# Patient Record
Sex: Female | Born: 1970 | Race: White | Hispanic: No | Marital: Married | Smoking: Never smoker
Health system: Southern US, Community
[De-identification: ages and names within clinical notes are randomized; demographics above are authoritative.]

## PROBLEM LIST (undated history)

## (undated) DIAGNOSIS — O34219 Maternal care for unspecified type scar from previous cesarean delivery: Secondary | ICD-10-CM

## (undated) DIAGNOSIS — R079 Chest pain, unspecified: Secondary | ICD-10-CM

## (undated) DIAGNOSIS — Z8679 Personal history of other diseases of the circulatory system: Secondary | ICD-10-CM

## (undated) DIAGNOSIS — R001 Bradycardia, unspecified: Secondary | ICD-10-CM

## (undated) DIAGNOSIS — A389 Scarlet fever, uncomplicated: Secondary | ICD-10-CM

## (undated) DIAGNOSIS — F4541 Pain disorder exclusively related to psychological factors: Secondary | ICD-10-CM

## (undated) DIAGNOSIS — S42009A Fracture of unspecified part of unspecified clavicle, initial encounter for closed fracture: Secondary | ICD-10-CM

## (undated) HISTORY — DX: Personal history of other diseases of the circulatory system: Z86.79

## (undated) HISTORY — DX: Bradycardia, unspecified: R00.1

---

## 1998-06-22 ENCOUNTER — Other Ambulatory Visit: Admission: RE | Admit: 1998-06-22 | Discharge: 1998-06-22 | Payer: Self-pay | Admitting: Obstetrics and Gynecology

## 2000-02-18 ENCOUNTER — Other Ambulatory Visit: Admission: RE | Admit: 2000-02-18 | Discharge: 2000-02-18 | Payer: Self-pay | Admitting: Obstetrics and Gynecology

## 2000-03-06 ENCOUNTER — Other Ambulatory Visit: Admission: RE | Admit: 2000-03-06 | Discharge: 2000-03-06 | Payer: Self-pay | Admitting: Obstetrics and Gynecology

## 2000-03-18 ENCOUNTER — Ambulatory Visit (HOSPITAL_COMMUNITY): Admission: RE | Admit: 2000-03-18 | Discharge: 2000-03-18 | Payer: Self-pay | Admitting: Obstetrics and Gynecology

## 2001-02-11 ENCOUNTER — Other Ambulatory Visit: Admission: RE | Admit: 2001-02-11 | Discharge: 2001-02-11 | Payer: Self-pay | Admitting: Obstetrics and Gynecology

## 2001-03-24 ENCOUNTER — Encounter: Payer: Self-pay | Admitting: Internal Medicine

## 2001-03-24 ENCOUNTER — Ambulatory Visit (HOSPITAL_COMMUNITY): Admission: RE | Admit: 2001-03-24 | Discharge: 2001-03-24 | Payer: Self-pay

## 2001-07-29 ENCOUNTER — Other Ambulatory Visit: Admission: RE | Admit: 2001-07-29 | Discharge: 2001-07-29 | Payer: Self-pay | Admitting: Obstetrics and Gynecology

## 2002-02-15 ENCOUNTER — Other Ambulatory Visit: Admission: RE | Admit: 2002-02-15 | Discharge: 2002-02-15 | Payer: Self-pay | Admitting: Obstetrics and Gynecology

## 2002-07-29 ENCOUNTER — Other Ambulatory Visit: Admission: RE | Admit: 2002-07-29 | Discharge: 2002-07-29 | Payer: Self-pay | Admitting: Obstetrics and Gynecology

## 2003-03-10 ENCOUNTER — Other Ambulatory Visit: Admission: RE | Admit: 2003-03-10 | Discharge: 2003-03-10 | Payer: Self-pay | Admitting: Obstetrics and Gynecology

## 2004-03-26 ENCOUNTER — Other Ambulatory Visit: Admission: RE | Admit: 2004-03-26 | Discharge: 2004-03-26 | Payer: Self-pay | Admitting: Obstetrics and Gynecology

## 2005-01-01 ENCOUNTER — Ambulatory Visit (HOSPITAL_COMMUNITY): Admission: RE | Admit: 2005-01-01 | Discharge: 2005-01-01 | Payer: Self-pay | Admitting: Obstetrics and Gynecology

## 2005-02-08 ENCOUNTER — Inpatient Hospital Stay (HOSPITAL_COMMUNITY): Admission: AD | Admit: 2005-02-08 | Discharge: 2005-02-11 | Payer: Self-pay | Admitting: Obstetrics and Gynecology

## 2005-03-22 ENCOUNTER — Other Ambulatory Visit: Admission: RE | Admit: 2005-03-22 | Discharge: 2005-03-22 | Payer: Self-pay | Admitting: Obstetrics and Gynecology

## 2008-07-27 ENCOUNTER — Emergency Department (HOSPITAL_COMMUNITY): Admission: EM | Admit: 2008-07-27 | Discharge: 2008-07-28 | Payer: Self-pay | Admitting: Emergency Medicine

## 2008-10-19 ENCOUNTER — Emergency Department (HOSPITAL_COMMUNITY): Admission: EM | Admit: 2008-10-19 | Discharge: 2008-10-19 | Payer: Self-pay | Admitting: Family Medicine

## 2009-02-06 ENCOUNTER — Emergency Department (HOSPITAL_COMMUNITY): Admission: EM | Admit: 2009-02-06 | Discharge: 2009-02-06 | Payer: Self-pay | Admitting: Cardiovascular Disease

## 2010-11-11 ENCOUNTER — Encounter: Payer: Self-pay | Admitting: Obstetrics and Gynecology

## 2011-01-30 LAB — POCT URINALYSIS DIP (DEVICE)
Bilirubin Urine: NEGATIVE
Glucose, UA: NEGATIVE mg/dL
Ketones, ur: NEGATIVE mg/dL
Nitrite: POSITIVE — AB
Protein, ur: NEGATIVE mg/dL
Specific Gravity, Urine: 1.01 (ref 1.005–1.030)
Urobilinogen, UA: 0.2 mg/dL (ref 0.0–1.0)
pH: 6 (ref 5.0–8.0)

## 2011-03-08 NOTE — Op Note (Signed)
NAMEFABIOLA, Tammy Gibbs                ACCOUNT NO.:  [id number]   MEDICAL RECORD NO.:  [id number]          PATIENT TYPE:  INP   LOCATION:  9118                          FACILITY:  [facility]   PHYSICIAN:  Zenaida Niece, M.D.DATE OF BIRTH:  1971/07/25   DATE OF PROCEDURE:  02/08/2005  DATE OF DISCHARGE:                                 OPERATIVE REPORT   PREOPERATIVE DIAGNOSES:  1.  Intrauterine pregnancy at 37+ weeks.  2.  Previous cesarean section.  3.  Premature rupture of membranes.  4.  Thrombocytopenia.   POSTOPERATIVE DIAGNOSES:  1.  Intrauterine pregnancy at 37+ weeks.  2.  Previous cesarean section.  3.  Premature rupture of membranes.  4.  Thrombocytopenia.   PROCEDURE:  Repeat low transverse cesarean section.   SURGEON:  Zenaida Niece, M.D.   ANESTHESIA:  General.   ESTIMATED BLOOD LOSS:  800 mL.   FINDINGS:  A viable female infant with Apgars of 9 and 9 and weighed 5 pounds  7 ounces.   PROCEDURE IN DETAIL:  The patient was taken to the operating room and placed  in the dorsal supine position with a left lateral tilt.  Abdomen was prepped  and draped in the usual sterile fashion and a Foley catheter was placed.  General anesthesia was then induced and abdomen was entered via her previous  Pfannenstiel incision.  Bladder blade was placed and the bladder retracted inferior.  A 4 cm  transverse incision was made in the lower uterine segment, pushing the  bladder inferior.  Uterine incision was extended bilaterally digitally.  The  fetal vertex was then grasped and brought to the incision.  It had some  difficulty delivering in through the incision.  The vacuum was applied and  with gentle traction the head delivered atraumatically.  Mouth and nares  were suctioned.  The remainder of the infant then delivered atraumatically.  Cord was doubly clamped and cut and the infant handed to the awaiting  pediatric team.  Cord blood was obtained and placenta delivered  spontaneously.  Uterus was  wiped dry with a clean lap pad and all clots and debris removed.  The  uterine incision was inspected and found to be free of the free of  extensions.  Uterine incision was then closed in one layer being running  locking layer with #1 chromic with adequate hemostasis.  Bleeding from  serosal edges was controlled with electrocautery.  Bladder was inspected and found to be inferior to the incision and urine was  clear.  Tubes and ovaries were inspected and found to be normal.  Uterine  incision was again inspected and found to be hemostatic.  Subfascial space  was then irrigated and made hemostatic with electrocautery.  Fascia was  closed in a running fashion starting at both ends and meeting in the middle  with 0 Vicryl.  Subcutaneous tissue was then irrigated and made hemostatic  with electrocautery.  Skin was then closed with staples and a sterile  dressing.  The patient tolerated the procedure well.  She was extubated in  the operating room and  taken to recovery in stable condition. Counts were  correct x2, and she received Ancef 1 g after cord clamp.      TDM/MEDQ  D:  02/08/2005  T:  02/08/2005  Job:  [id number]

## 2011-03-08 NOTE — Discharge Summary (Signed)
Tammy Gibbs, Tammy Gibbs NO.:  [id number]   MEDICAL RECORD NO.:  [id number]          PATIENT TYPE:  INP   LOCATION:  9118                          [facility]ILITY:  [facility]   PHYSICIAN:  Malachi Pro. Ambrose Mantle, M.D. DATE OF BIRTH:  03-Oct-1971   DATE OF ADMISSION:  02/08/2005  DATE OF DISCHARGE:  02/10/2005                                 DISCHARGE SUMMARY   HISTORY:  This 40 year old white female para 1-0-0-1, gravida 2 at 3 plus  weeks gestation by last period compatible with a 9-week ultrasound with [facility]  Feb 26, 2005 presented complaining of spontaneous rupture of membranes at  9:45 p.m. on February 07, 2005 with occasional contractions, no vaginal  bleeding, good fetal movement.  The patient had had a prior cesarean  section.  Blood group and type A positive, negative antibody, RPR  nonreactive, rubella immune, hepatitis B surface antigen negative, GC and  Chlamydia negative, triple screen normal, 1-hour glucola 89.  Patient had a  previous C-section.  She was scheduled for repeat C-section in 1 week, size  was less than dates with ultrasound on January 02, 2005, average gestational  age with estimated fetal weight of approximately 1600 g with normal AFI.   OBSTETRIC HISTORY:  The patient had a low transverse cervical cesarean  section at 37 plus weeks in 1995 for fetopelvic disproportion 5-pound 7-  ounce infant.   GYNECOLOGIC HISTORY:  LEEP and cone biopsy in 2001 with normal followup.   MEDICAL HISTORY:  Negative.   SURGICAL HISTORY:  C-section.   MEDICATIONS:  None.   ALLERGIES:  No known drug allergies.   SOCIAL HISTORY:  Married.  No tobacco.   FAMILY HISTORY:  Negative.   HOSPITAL COURSE:  On admission the patient was contracting every 4-5  minutes, normal fetal heart tones, patient's abdomen was gravid and  nontender, transverse scar was present.  Vaginal exam was deferred.  The  patient had positive Nitrazine and positive fern.  Heart and lungs were  normal.  The  patient wanted a repeat C-section and Dr. Jackelyn Knife proceeded  with the low transverse cervical C-section with delivery of a living female  infant 5 pounds 7 ounces with Apgars of 9 at one and 9 at five minutes.  Postpartum the patient did quite well, she had no problems, she voided well  without difficulty and tolerated a regular diet, passed flatus, her incision  healed well and staples were removed and strips were applied on the day of  discharge.  On the second postpartum day the patient was doing as previously  stated and was ready for discharge.   LABORATORY DATA:  Initial urinalysis showed 30 mg% of protein, a hemoglobin  of 12.5, hematocrit 37.2, white count 10,500, platelet count 74,000.  Repeat  hemoglobin was 11.3, hematocrit 33.2, white count 9700, platelet count  68,000.   FINAL DIAGNOSIS:  Intrauterine pregnancy at 37 plus weeks with prior  cesarean section in labor with ruptured membranes.  Patient was prepared and  underwent a cesarean section.   OPERATION:  Low transverse cervical cesarean section.   ADDITIONAL DIAGNOSIS:  Thrombocytopenia.  The patient should have this  platelet count repeated at followup examinations in the office.  The patient  declines analgesics at discharge and is advised to return in 10-14 days for  followup examination.  She is given a copy of our discharge instruction  booklet.      TFH/MEDQ  D:  02/10/2005  T:  02/10/2005  Job:  [id number]

## 2011-05-27 ENCOUNTER — Ambulatory Visit (HOSPITAL_COMMUNITY)
Admission: RE | Admit: 2011-05-27 | Discharge: 2011-05-27 | Disposition: A | Discharge status: Home / Self Care | Payer: BC Managed Care – PPO | Source: Ambulatory Visit | Attending: Internal Medicine | Admitting: Internal Medicine

## 2011-05-27 ENCOUNTER — Other Ambulatory Visit (HOSPITAL_COMMUNITY): Payer: Self-pay | Admitting: Internal Medicine

## 2011-05-27 DIAGNOSIS — IMO0002 Reserved for concepts with insufficient information to code with codable children: Secondary | ICD-10-CM

## 2011-05-27 DIAGNOSIS — M546 Pain in thoracic spine: Secondary | ICD-10-CM

## 2011-05-27 DIAGNOSIS — M549 Dorsalgia, unspecified: Secondary | ICD-10-CM | POA: Insufficient documentation

## 2011-07-23 LAB — CK TOTAL AND CKMB (NOT AT ARMC)
CK, MB: 1.4
Relative Index: INVALID

## 2011-07-23 LAB — CBC
HCT: 43.1
Platelets: 149 — ABNORMAL LOW
RDW: 13.8

## 2011-07-23 LAB — DIFFERENTIAL
Basophils Relative: 0
Monocytes Absolute: 0.7
Monocytes Relative: 7
Neutro Abs: 5.6

## 2011-07-23 LAB — TROPONIN I: Troponin I: 0.02

## 2011-07-23 LAB — COMPREHENSIVE METABOLIC PANEL
AST: 22
Albumin: 4.1
Alkaline Phosphatase: 73
BUN: 13
GFR calc Af Amer: >60
Potassium: 4.2
Sodium: 136
Total Protein: 7.5

## 2011-07-23 LAB — POCT CARDIAC MARKERS: Troponin i, poc: <0.05

## 2011-07-26 LAB — POCT URINALYSIS DIP (DEVICE)
Bilirubin Urine: NEGATIVE
Ketones, ur: NEGATIVE mg/dL
pH: 6 (ref 5.0–8.0)

## 2011-07-26 LAB — URINE CULTURE: Colony Count: >=100000

## 2011-10-22 HISTORY — DX: Maternal care for unspecified type scar from previous cesarean delivery: O34.219

## 2012-07-21 DIAGNOSIS — Z8679 Personal history of other diseases of the circulatory system: Secondary | ICD-10-CM

## 2012-07-21 DIAGNOSIS — R001 Bradycardia, unspecified: Secondary | ICD-10-CM

## 2012-07-21 HISTORY — DX: Bradycardia, unspecified: R00.1

## 2012-07-21 HISTORY — DX: Personal history of other diseases of the circulatory system: Z86.79

## 2012-08-28 ENCOUNTER — Encounter (HOSPITAL_BASED_OUTPATIENT_CLINIC_OR_DEPARTMENT_OTHER): Payer: Self-pay | Admitting: *Deleted

## 2012-08-28 ENCOUNTER — Inpatient Hospital Stay (HOSPITAL_BASED_OUTPATIENT_CLINIC_OR_DEPARTMENT_OTHER)
Admission: EM | Admit: 2012-08-28 | Discharge: 2012-08-30 | DRG: 145 | Disposition: A | Discharge status: Home / Self Care | Payer: BC Managed Care – PPO | Attending: Pulmonary Disease | Admitting: Pulmonary Disease

## 2012-08-28 ENCOUNTER — Emergency Department (HOSPITAL_BASED_OUTPATIENT_CLINIC_OR_DEPARTMENT_OTHER): Payer: BC Managed Care – PPO

## 2012-08-28 DIAGNOSIS — I959 Hypotension, unspecified: Principal | ICD-10-CM | POA: Diagnosis present

## 2012-08-28 DIAGNOSIS — I498 Other specified cardiac arrhythmias: Secondary | ICD-10-CM

## 2012-08-28 DIAGNOSIS — R001 Bradycardia, unspecified: Secondary | ICD-10-CM

## 2012-08-28 HISTORY — DX: Scarlet fever, uncomplicated: A38.9

## 2012-08-28 HISTORY — DX: Chest pain, unspecified: R07.9

## 2012-08-28 HISTORY — DX: Fracture of unspecified part of unspecified clavicle, initial encounter for closed fracture: S42.009A

## 2012-08-28 HISTORY — DX: Pain disorder exclusively related to psychological factors: F45.41

## 2012-08-28 LAB — CK TOTAL AND CKMB (NOT AT ARMC)
CK, MB: 3.2 ng/mL (ref 0.3–4.0)
Relative Index: INVALID (ref 0.0–2.5)
Total CK: 69 U/L (ref 7–177)

## 2012-08-28 LAB — CBC WITH DIFFERENTIAL/PLATELET
Basophils Absolute: 0 10*3/uL (ref 0.0–0.1)
Eosinophils Relative: 4 % (ref 0–5)
Eosinophils Relative: 3 % (ref 0–5)
HCT: 41 % (ref 36.0–46.0)
Hemoglobin: 14.2 g/dL (ref 12.0–15.0)
Lymphocytes Relative: 24 % (ref 12–46)
Lymphocytes Relative: 29 % (ref 12–46)
Lymphs Abs: 2.3 10*3/uL (ref 0.7–4.0)
MCV: 86.3 fL (ref 78.0–100.0)
Monocytes Absolute: 0.4 10*3/uL (ref 0.1–1.0)
Neutro Abs: 5.5 10*3/uL (ref 1.7–7.7)
Platelets: 134 10*3/uL — ABNORMAL LOW (ref 150–400)
RBC: 4.75 MIL/uL (ref 3.87–5.11)
RDW: 13.1 % (ref 11.5–15.5)
WBC: 8.5 10*3/uL (ref 4.0–10.5)
WBC: 8 10*3/uL (ref 4.0–10.5)

## 2012-08-28 LAB — COMPREHENSIVE METABOLIC PANEL
ALT: 13 U/L (ref 0–35)
AST: 18 U/L (ref 0–37)
Albumin: 3.8 g/dL (ref 3.5–5.2)
BUN: 11 mg/dL (ref 6–23)
CO2: 25 mEq/L (ref 19–32)
Calcium: 9.4 mg/dL (ref 8.4–10.5)
Chloride: 98 mEq/L (ref 96–112)
Creatinine, Ser: 0.68 mg/dL (ref 0.50–1.10)
GFR calc non Af Amer: >90 mL/min (ref >90)
Potassium: 3.9 mEq/L (ref 3.5–5.1)
Sodium: 133 mEq/L — ABNORMAL LOW (ref 135–145)
Total Protein: 7.4 g/dL (ref 6.0–8.3)

## 2012-08-28 LAB — URINALYSIS, ROUTINE W REFLEX MICROSCOPIC
Bilirubin Urine: NEGATIVE
Glucose, UA: NEGATIVE mg/dL
Hgb urine dipstick: NEGATIVE
Specific Gravity, Urine: 1.017 (ref 1.005–1.030)
Urobilinogen, UA: 0.2 mg/dL (ref 0.0–1.0)

## 2012-08-28 LAB — LACTIC ACID, PLASMA: Lactic Acid, Venous: 0.7 mmol/L (ref 0.5–2.2)

## 2012-08-28 LAB — TROPONIN I: Troponin I: <0.3 ng/mL (ref <0.30)

## 2012-08-28 MED ORDER — ACETAMINOPHEN 650 MG RE SUPP: 650.0000 mg | Freq: Four times a day (QID) | RECTAL | Status: DC | PRN

## 2012-08-28 MED ORDER — ACETAMINOPHEN 325 MG PO TABS
650.0000 mg | ORAL_TABLET | Freq: Four times a day (QID) | ORAL | Status: DC | PRN
  Administered 2012-08-29: 650 mg via ORAL
  Filled 2012-08-28: qty 2

## 2012-08-28 MED ORDER — SODIUM CHLORIDE 0.9 % IJ SOLN
3.0000 mL | Freq: Two times a day (BID) | INTRAMUSCULAR | Status: DC
  Administered 2012-08-29 – 2012-08-30 (×2): 3 mL via INTRAVENOUS

## 2012-08-28 MED ORDER — SODIUM CHLORIDE 0.9 % IV SOLN
Freq: Once | INTRAVENOUS | Status: AC
  Administered 2012-08-28: 14:00:00 via INTRAVENOUS

## 2012-08-28 NOTE — ED Notes (Signed)
Family at bedside. 

## 2012-08-28 NOTE — ED Provider Notes (Signed)
History     CSN: [id number]  Arrival date & time 08/28/12  1215   First MD Initiated Contact with Patient 08/28/12 1256      Chief Complaint  Patient presents with  . Dizziness  . Tingling  . Fatigue    (Consider location/radiation/quality/duration/timing/severity/associated sxs/prior treatment) Patient is a 41 y.o. female presenting with weakness. The history is provided by the patient. No language interpreter was used.  Weakness The primary symptoms include focal weakness. Primary symptoms do not include fever, nausea or vomiting. The symptoms began 6 to 12 hours ago. The symptoms are worsening.  Additional symptoms include weakness. Medical issues do not include seizures, cancer, alcohol use, drug use or diabetes.  Pt reports she had 1st episode of weakness on 10/31.   Pt reports she felt dizzy and had tingling in the top of her head.  Pt reports she had an episode last night that resolved.  Pt reports weak this am.   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     History reviewed. No pertinent past medical history.  History reviewed. No pertinent past surgical history.  History reviewed. No pertinent family history.  History  Substance Use Topics  . Smoking status: Never Smoker   . Smokeless tobacco: Not on file  . Alcohol Use:     OB History    Grav Para Term Preterm Abortions TAB SAB Ect Mult Living                  Review of Systems  Constitutional: Negative for fever.  Gastrointestinal: Negative for nausea and vomiting.  Neurological: Positive for focal weakness and weakness.  All other systems reviewed and are negative.    Allergies  Review of patient's allergies indicates no known allergies.  Home Medications   Current Outpatient Rx  Name  Route  Sig  Dispense  Refill  . LEVONORGESTREL 20 MCG/24HR IU IUD   Intrauterine   1 each by Intrauterine route once.           BP 87/59  Pulse 64  Physical Exam  Nursing note and vitals  reviewed. Constitutional: She is oriented to person, place, and time. She appears well-developed and well-nourished.  HENT:  Head: Normocephalic and atraumatic.  Right Ear: External ear normal.  Left Ear: External ear normal.  Nose: Nose normal.  Mouth/Throat: Oropharynx is clear and moist.  Eyes: Conjunctivae normal and EOM are normal. Pupils are equal, round, and reactive to light.  Neck: Normal range of motion. Neck supple.  Cardiovascular: Normal rate and normal heart sounds.   Pulmonary/Chest: Effort normal and breath sounds normal.  Abdominal: Soft. Bowel sounds are normal.  Musculoskeletal: Normal range of motion.  Neurological: She is alert and oriented to person, place, and time. She has normal reflexes.  Skin: Skin is warm.  Psychiatric: She has a normal mood and affect.    ED Course  Procedures (including critical care time)  Labs Reviewed - No data to display No results found.   1. Hypotension   2. Bradycardia    Pt given Iv fluids x 1 liter,  No change in blood pressure or heart rate.   I reviewed monitor strips with Dr. Karma Ganja.   No sign of heart block.  Labs returned no sign of dehydration,  Cardiac markers normal   Date: 08/28/2012  Rate: 49  Rhythm: sinus bradycardia  QRS Axis: normal  Intervals: normal  ST/T Wave abnormalities: normal  Conduction Disutrbances:none  Narrative Interpretation:  Old EKG Reviewed: changes noted bradycardia  MDM    I spoke to Hospitalist who advised CCM consult.   Critical Care advised cardiology.   I spoke to Dr. Eden Emms who reviewed ekg and labs and advised medical admission.   I spoke to triad hospitalist a second time and then to critical care a 2nd time.   Critical Care,   Dr. Herma Carson agreed to accept pt.  Pt to go to [facility] ICU for admision.   CRITICAL CARE Performed by: [facility]   Total critical care time: 30  Critical care time was exclusive of separately billable procedures and treating other patients.  Critical  care was necessary to treat or prevent imminent or life-threatening deterioration.  Critical care was time spent personally by me on the following activities: development of treatment plan with patient and/or surrogate as well as nursing, discussions with consultants, evaluation of patient's response to treatment, examination of patient, obtaining history from patient or surrogate, ordering and performing treatments and interventions, ordering and review of laboratory studies, ordering and review of radiographic studies, pulse oximetry and re-evaluation of patient's condition.  Lonia Skinner [place], [place] 08/28/12 1710

## 2012-08-28 NOTE — ED Notes (Signed)
Pt. Is saline locked with no fluids infusing at present time.

## 2012-08-28 NOTE — H&P (Signed)
Name: Tammy Gibbs MRN: [id number] DOB: 1971/03/06    LOS: 0   PULMONARY / CRITICAL CARE MEDICINE ADMISSION  HPI:  41 yo F with minimal past medical history transferred from [facility] with bradycardia and hypotension.  Yesterday evening she developed fatigue and dizziness.  She drank several glasses of fluids and went to bed.  This AM she continued to have symptoms and saw here PCP who sent her to the [facility] for IVF given SBP in 80s.  There she was noted to have a HR in the 40s and SBP in the 70s-80s.  She received IVF with modest improvement and was transferred to [facility].  Curently HR ~60 and SBP > 100.  Over the past 24 hours she has also had episodes of 24 hours she has also had short episodes of tingling in her head and fingers.    She has a similar episode 1 week ago which resolved.  She currently feels better with only mild fatigue. She denies fever, chills, CP, nausea, abdominal pain, palpitations, syncope, or pain.  No recent medications.  Had chest pain several years ago and had work-up with stress test and echo which was normal.  History reviewed. No pertinent past medical history. History reviewed. No pertinent past surgical history. Prior to Admission medications   Medication Sig Start Date End Date Taking? Authorizing Provider  butalbital-acetaminophen-caffeine (FIORICET, ESGIC) 50-325-40 MG per tablet Take 1 tablet by mouth daily as needed. For headache   Yes Historical Provider, MD  ibuprofen (ADVIL,MOTRIN) 200 MG tablet Take 200 mg by mouth every 6 (six) hours as needed. For head aches   Yes Historical Provider, MD  levonorgestrel (MIRENA) 20 MCG/24HR IUD 1 each by Intrauterine route once.   Yes Historical Provider, MD   Allergies No Known Allergies  Family History History reviewed. No pertinent family history.  HTN and HPL in parents. Social History  reports that she has never smoked. She does not have any smokeless tobacco history on file. Her  alcohol and drug histories not on file.  Works as ED Charity fundraiser.  Review Of Systems:  Review of 10 systems negative except as listed in HPI.  Brief patient description:  41 yo F with bradycardia and hypotension.  Events Since Admission:   Current Status:  Vital Signs: Temp:  [98.6 F (37 C)] 98.6 F (37 C) (11/08 1715) Pulse Rate:  [52-73] 69  (11/08 2000) Resp:  [16-17] 16  (11/08 2000) BP: (77-114)/(45-65) 114/61 mmHg (11/08 2000) SpO2:  [96 %-100 %] 100 % (11/08 2000) Weight:  [60.328 kg (133 lb)] 60.328 kg (133 lb) (11/08 1715)  Physical Examination: General:  NAD, well appearing Neuro:  Awake, alert, oriented x 3, full str in all 4 extremities, normal sensation in all 4 extremities HEENT:  PERRL, EOMI, sclera clear, MMM, no oral lesions. Neck:  Supple, no cervical or supraclavicular LAD, no thyromegaly Cardiovascular:  Slightly bradycardic, regular, no m/r/g Lungs:  CTAB, nl WOB Abdomen:  Soft, NT, ND Musculoskeletal:  Joints wnl Skin:  No rash Extremities: No edema or cyanosis   CMP     Component Value Date/Time   NA 133* 08/28/2012 1250   K 4.1 08/28/2012 1250   CL 98 08/28/2012 1250   CO2 25 08/28/2012 1250   GLUCOSE 96 08/28/2012 1250   BUN 14 08/28/2012 1250   CREATININE 0.80 08/28/2012 1250   CALCIUM 9.4 08/28/2012 1250   PROT 7.1 08/28/2012 1250   ALBUMIN 3.6 08/28/2012 1250  AST 18 08/28/2012 1250   ALT 13 08/28/2012 1250   ALKPHOS 61 08/28/2012 1250   BILITOT 0.3 08/28/2012 1250   GFRNONAA >90 08/28/2012 1250   GFRAA >90 08/28/2012 1250   Cardiac Panel (last 3 results)  Basename 08/28/12 1555  CKTOTAL 69  CKMB 3.2  TROPONINI <0.30  RELINDX RELATIVE INDEX IS INVALID   CBC    Component Value Date/Time   WBC 8.5 08/28/2012 1250   RBC 4.49 08/28/2012 1250   HGB 13.1 08/28/2012 1250   HCT 38.1 08/28/2012 1250   PLT 134* 08/28/2012 1250   MCV 84.9 08/28/2012 1250   MCH 29.2 08/28/2012 1250   MCHC 34.4 08/28/2012 1250   RDW 13.1 08/28/2012 1250   LYMPHSABS 2.0  08/28/2012 1250   MONOABS 0.6 08/28/2012 1250   EOSABS 0.3 08/28/2012 1250   BASOSABS 0.0 08/28/2012 1250   CXR - No acute cardiopulmonary abnormality seen.   EKG - sinus brady, rate 58   ASSESSMENT AND PLAN  41 yo F with dizziness and fatigue likely due to bradycardia and hypotension. - Monitor on tele - Repeat chem, CBC and cardiac enzymes now,. Check TSH - Repeat EKG in AM - Order TTE - Will need to consult cardiology in AM  BEST PRACTICE / DISPOSITION Level of Care:  ICU Primary Service:  PCCM Consultants:  Card needs to be consulted in AM Code Status:  Full Diet:  Regular DVT Px:  SCDs GI Px:  No indicated Skin Integrity:  No issues Social / Family:  Not present at time of admission   Tarra Pence, M.D. Pulmonary and Critical Care Medicine [facility] Pager: [phone]  08/28/2012, 8:14 PM

## 2012-08-28 NOTE — ED Notes (Signed)
Pt to room 10 in w/c able to stand and walk to stretcher from w/c with cg@. Pt reports feeling dizzy, weak and tingling to top of her head on the night of 10/31. Pt states these sx resolved, but returned this morning. Pt states she had tingling in the top of her head, dizzy feelings and generalized weakness this am. Pt speech is clear, moe + x 4 ext, grips are = and strong, smile is symetrical, pearl. Pt denies any ha or any other c/o.

## 2012-08-28 NOTE — ED Notes (Signed)
Pt.  Report given to [employer] in ICU at [facility].

## 2012-08-29 ENCOUNTER — Encounter (HOSPITAL_COMMUNITY): Payer: Self-pay | Admitting: *Deleted

## 2012-08-29 DIAGNOSIS — I959 Hypotension, unspecified: Principal | ICD-10-CM

## 2012-08-29 DIAGNOSIS — R001 Bradycardia, unspecified: Secondary | ICD-10-CM | POA: Diagnosis present

## 2012-08-29 DIAGNOSIS — I498 Other specified cardiac arrhythmias: Secondary | ICD-10-CM

## 2012-08-29 LAB — MRSA PCR SCREENING: MRSA by PCR: POSITIVE — AB

## 2012-08-29 LAB — CK TOTAL AND CKMB (NOT AT ARMC): Relative Index: INVALID (ref 0.0–2.5)

## 2012-08-29 LAB — TSH: TSH: 1.254 u[IU]/mL (ref 0.350–4.500)

## 2012-08-29 MED ORDER — MUPIROCIN 2 % EX OINT
1.0000 "application " | TOPICAL_OINTMENT | Freq: Two times a day (BID) | CUTANEOUS | Status: DC
  Administered 2012-08-29 – 2012-08-30 (×3): 1 via NASAL
  Filled 2012-08-29: qty 22

## 2012-08-29 MED ORDER — CHLORHEXIDINE GLUCONATE CLOTH 2 % EX PADS
6.0000 | MEDICATED_PAD | Freq: Every day | CUTANEOUS | Status: DC
  Administered 2012-08-29 – 2012-08-30 (×2): 6 via TOPICAL

## 2012-08-29 MED ORDER — INFLUENZA VIRUS VACC SPLIT PF IM SUSP
0.5000 mL | INTRAMUSCULAR | Status: DC
  Filled 2012-08-29: qty 0.5

## 2012-08-29 NOTE — Progress Notes (Signed)
Vascular lab here to do 2D Echo.

## 2012-08-29 NOTE — Progress Notes (Signed)
CRITICAL VALUE ALERT  Critical value received: +PCR MRSA swab   Date of notification:  08/29/12  Time of notification:  1009  Critical value read back:yes  Nurse who received alert:  Harlow Asa, RN  MD notified (1st page):  Randon Goldsmith, NP  Time of first page:  1050  MD notified (2nd page):  Time of second page:  Responding MD: Dirk Dress, NP  Time MD responded:  1057

## 2012-08-29 NOTE — Progress Notes (Signed)
Transferred to 3W 04 via wheelchair and with monitor after report called to Bosie Clos, Charity fundraiser.Transported by Jamal Maes, Rn. On generic medical pathway.

## 2012-08-29 NOTE — Progress Notes (Signed)
Name: KATALEAH BEJAR MRN: [id number] DOB: August 28, 1971    LOS: 1   PULMONARY / CRITICAL CARE MEDICINE ADMISSION  HPI:  41 yo F with minimal past medical history transferred from [facility] with bradycardia and hypotension.  Yesterday evening she developed fatigue and dizziness.  She drank several glasses of fluids and went to bed.  This AM she continued to have symptoms and saw here PCP who sent her to the [facility] for IVF given SBP in 80s.  There she was noted to have a HR in the 40s and SBP in the 70s-80s.  She received IVF with modest improvement and was transferred to [facility].  Curently HR ~60 and SBP > 100.  Over the past 24 hours she has also had episodes of 24 hours she has also had short episodes of tingling in her head and fingers.    She has a similar episode 1 week ago which resolved.  She currently feels better with only mild fatigue. She denies fever, chills, CP, nausea, abdominal pain, palpitations, syncope, or pain.  No recent medications.  Had chest pain several years ago and had work-up with stress test and echo which was normal.  Brief patient description:  41 yo F with bradycardia and hypotension.  Events Since Admission: Improved 11/9. HR now in low 80s  NSR  Current Status: Stable  Vital Signs: Temp:  [97.9 F (36.6 C)-98.6 F (37 C)] 98.3 F (36.8 C) (11/09 0736) Pulse Rate:  [52-93] 92  (11/09 0853) Resp:  [11-19] 13  (11/09 0853) BP: (77-114)/(44-65) 98/54 mmHg (11/09 0800) SpO2:  [95 %-100 %] 98 % (11/09 0853) Weight:  [60.328 kg (133 lb)] 60.328 kg (133 lb) (11/08 1715)  Physical Examination: General:  NAD, well appearing Neuro:  Awake, alert, oriented x 3, full str in all 4 extremities, normal sensation in all 4 extremities HEENT:  PERRL, EOMI, sclera clear, MMM, no oral lesions. Neck:  Supple, no cervical or supraclavicular LAD, no thyromegaly Cardiovascular:  Slightly bradycardic, regular, no m/r/g Lungs:  CTAB, nl WOB Abdomen:  Soft,  NT, ND Musculoskeletal:  Joints wnl Skin:  No rash Extremities: No edema or cyanosis   CMP     Component Value Date/Time   NA 138 08/28/2012 2042   K 3.9 08/28/2012 2042   CL 104 08/28/2012 2042   CO2 23 08/28/2012 2042   GLUCOSE 76 08/28/2012 2042   BUN 11 08/28/2012 2042   CREATININE 0.68 08/28/2012 2042   CALCIUM 9.3 08/28/2012 2042   PROT 7.4 08/28/2012 2042   ALBUMIN 3.8 08/28/2012 2042   AST 20 08/28/2012 2042   ALT 14 08/28/2012 2042   ALKPHOS 70 08/28/2012 2042   BILITOT 0.5 08/28/2012 2042   GFRNONAA >90 08/28/2012 2042   GFRAA >90 08/28/2012 2042   Cardiac Panel (last 3 results)  Basename 08/28/12 2042 08/28/12 1555  CKTOTAL 88 69  CKMB 3.5 3.2  TROPONINI -- <0.30  RELINDX RELATIVE INDEX IS INVALID RELATIVE INDEX IS INVALID   CBC    Component Value Date/Time   WBC 8.0 08/28/2012 2042   RBC 4.75 08/28/2012 2042   HGB 14.2 08/28/2012 2042   HCT 41.0 08/28/2012 2042   PLT 108* 08/28/2012 2042   MCV 86.3 08/28/2012 2042   MCH 29.9 08/28/2012 2042   MCHC 34.6 08/28/2012 2042   RDW 13.2 08/28/2012 2042   LYMPHSABS 2.3 08/28/2012 2042   MONOABS 0.4 08/28/2012 2042   EOSABS 0.3 08/28/2012 2042   BASOSABS 0.0  08/28/2012 2042   CXR - No acute cardiopulmonary abnormality seen.   EKG - sinus rhythm 80  ASSESSMENT AND PLAN  41 yo F with dizziness and fatigue likely due to bradycardia and hypotension.  ??etiology W/u for chest pain three years ago per [facility] unremarkable - Monitor on tele>>tfr to tele bed out of ICU -Cardiology consult, Dr Tresa Endo to see  Keep on PCCM svc as may d/c in 24hrs.   BEST PRACTICE / DISPOSITION Level of Care:  ICU>>>TELE Primary Service:  PCCM Consultants:  Card Tresa Endo Code Status:  Full Diet:  Regular DVT Px:  SCDs GI Px:  No indicated Skin Integrity:  No issues Social / Family:updated at bedside  Shan Levans, M.D. Pulmonary and Critical Care Medicine [facility]  [phone]  Cell  [phone]  If no response or cell goes to  voicemail, call beeper [phone]   08/29/2012, 12:02 PM

## 2012-08-29 NOTE — Consult Note (Signed)
Reason for Consult:  Bradycardia Referring Physician: [name] is an 41 y.o. female.  HPI:   The patient is a 41 yo female with a history of chest pain three years ago, scarlet fever.  Never used tobacco.  She had a cardiology work-up at [facility] three years ago which included stress and echo.  Apparently everything was ok.   She started to feel a "tingling" feeling on the right, top side of her head on holloween night along with her fingers and feet, as well as dizziness and slurred speech.  She went to bed that night and felt better the next day. All symptoms returned, except slurred speech, and occurred at rest.  She went to med [facility] after seeing her PCP for fluids.  BP at the time was in the 70's with a HR in the 40-50's.  She denies N,V, fever, cough, congestion, CP, SOB, othopnea, LEE, dysuria, hematuria, diarrhea, constipation.  She currently feels better.  Past Medical History  Diagnosis Date  . Pregnancy with history of caesarean section, antepartum   . Clavicular fracture   . Chest pain   . Scarlet fever   . Stress headaches     History reviewed. No pertinent past surgical history.  History reviewed. No pertinent family history.   MI,  Grandfather with Afib.    Social History:  reports that she has never smoked. She does not have any smokeless tobacco history on file. Her alcohol and drug histories not on file.  Allergies: No Known Allergies  Medications:     . [COMPLETED] sodium chloride   Intravenous Once  . Chlorhexidine Gluconate Cloth  6 each Topical Q0600  . influenza  inactive virus vaccine  0.5 mL Intramuscular Tomorrow-1000  . mupirocin ointment  1 application Nasal BID  . sodium chloride  3 mL Intravenous Q12H     Results for orders placed during the hospital encounter of 08/28/12 (from the past 48 hour(s))  CBC WITH DIFFERENTIAL     Status: Abnormal   Collection Time   08/28/12 12:50 PM      Component Value Range Comment   WBC 8.5  4.0 -  10.5 K/uL    RBC 4.49  3.87 - 5.11 MIL/uL    Hemoglobin 13.1  12.0 - 15.0 g/dL    HCT 16.1  09.6 - 04.5 %    MCV 84.9  78.0 - 100.0 fL    MCH 29.2  26.0 - 34.0 pg    MCHC 34.4  30.0 - 36.0 g/dL    RDW 40.9  81.1 - 91.4 %    Platelets 134 (*) 150 - 400 K/uL    Neutrophils Relative 65  43 - 77 %    Neutro Abs 5.5  1.7 - 7.7 K/uL    Lymphocytes Relative 24  12 - 46 %    Lymphs Abs 2.0  0.7 - 4.0 K/uL    Monocytes Relative 8  3 - 12 %    Monocytes Absolute 0.6  0.1 - 1.0 K/uL    Eosinophils Relative 4  0 - 5 %    Eosinophils Absolute 0.3  0.0 - 0.7 K/uL    Basophils Relative 0  0 - 1 %    Basophils Absolute 0.0  0.0 - 0.1 K/uL   COMPREHENSIVE METABOLIC PANEL     Status: Abnormal   Collection Time   08/28/12 12:50 PM      Component Value Range Comment   Sodium 133 (*) 135 -  145 mEq/L    Potassium 4.1  3.5 - 5.1 mEq/L    Chloride 98  96 - 112 mEq/L    CO2 25  19 - 32 mEq/L    Glucose, Bld 96  70 - 99 mg/dL    BUN 14  6 - 23 mg/dL    Creatinine, Ser 1.61  0.50 - 1.10 mg/dL    Calcium 9.4  8.4 - 09.6 mg/dL    Total Protein 7.1  6.0 - 8.3 g/dL    Albumin 3.6  3.5 - 5.2 g/dL    AST 18  0 - 37 U/L    ALT 13  0 - 35 U/L    Alkaline Phosphatase 61  39 - 117 U/L    Total Bilirubin 0.3  0.3 - 1.2 mg/dL    GFR calc non Af Amer >90  >90 mL/min    GFR calc Af Amer >90  >90 mL/min   URINALYSIS, ROUTINE W REFLEX MICROSCOPIC     Status: Normal   Collection Time   08/28/12  2:01 PM      Component Value Range Comment   Color, Urine YELLOW  YELLOW    APPearance CLEAR  CLEAR    Specific Gravity, Urine 1.017  1.005 - 1.030    pH 6.5  5.0 - 8.0    Glucose, UA NEGATIVE  NEGATIVE mg/dL    Hgb urine dipstick NEGATIVE  NEGATIVE    Bilirubin Urine NEGATIVE  NEGATIVE    Ketones, ur NEGATIVE  NEGATIVE mg/dL    Protein, ur NEGATIVE  NEGATIVE mg/dL    Urobilinogen, UA 0.2  0.0 - 1.0 mg/dL    Nitrite NEGATIVE  NEGATIVE    Leukocytes, UA NEGATIVE  NEGATIVE MICROSCOPIC NOT DONE ON URINES WITH NEGATIVE  PROTEIN, BLOOD, LEUKOCYTES, NITRITE, OR GLUCOSE <1000 mg/dL.  PREGNANCY, URINE     Status: Normal   Collection Time   08/28/12  2:01 PM      Component Value Range Comment   Preg Test, Ur NEGATIVE  NEGATIVE   TROPONIN I     Status: Normal   Collection Time   08/28/12  3:55 PM      Component Value Range Comment   Troponin I <0.30  <0.30 ng/mL   CK TOTAL AND CKMB     Status: Normal   Collection Time   08/28/12  3:55 PM      Component Value Range Comment   Total CK 69  7 - 177 U/L    CK, MB 3.2  0.3 - 4.0 ng/mL    Relative Index RELATIVE INDEX IS INVALID  0.0 - 2.5   SEDIMENTATION RATE     Status: Normal   Collection Time   08/28/12  3:55 PM      Component Value Range Comment   Sed Rate 4  0 - 22 mm/hr   LACTIC ACID, PLASMA     Status: Normal   Collection Time   08/28/12  4:03 PM      Component Value Range Comment   Lactic Acid, Venous 0.7  0.5 - 2.2 mmol/L   COMPREHENSIVE METABOLIC PANEL     Status: Normal   Collection Time   08/28/12  8:42 PM      Component Value Range Comment   Sodium 138  135 - 145 mEq/L    Potassium 3.9  3.5 - 5.1 mEq/L    Chloride 104  96 - 112 mEq/L    CO2 23  19 - 32 mEq/L  Glucose, Bld 76  70 - 99 mg/dL    BUN 11  6 - 23 mg/dL    Creatinine, Ser 2.95  0.50 - 1.10 mg/dL    Calcium 9.3  8.4 - 62.1 mg/dL    Total Protein 7.4  6.0 - 8.3 g/dL    Albumin 3.8  3.5 - 5.2 g/dL    AST 20  0 - 37 U/L    ALT 14  0 - 35 U/L    Alkaline Phosphatase 70  39 - 117 U/L    Total Bilirubin 0.5  0.3 - 1.2 mg/dL    GFR calc non Af Amer >90  >90 mL/min    GFR calc Af Amer >90  >90 mL/min   CBC WITH DIFFERENTIAL     Status: Abnormal   Collection Time   08/28/12  8:42 PM      Component Value Range Comment   WBC 8.0  4.0 - 10.5 K/uL    RBC 4.75  3.87 - 5.11 MIL/uL    Hemoglobin 14.2  12.0 - 15.0 g/dL    HCT 30.8  65.7 - 84.6 %    MCV 86.3  78.0 - 100.0 fL    MCH 29.9  26.0 - 34.0 pg    MCHC 34.6  30.0 - 36.0 g/dL    RDW 96.2  95.2 - 84.1 %    Platelets 108 (*) 150 -  400 K/uL    Neutrophils Relative 63  43 - 77 %    Neutro Abs 5.1  1.7 - 7.7 K/uL    Lymphocytes Relative 29  12 - 46 %    Lymphs Abs 2.3  0.7 - 4.0 K/uL    Monocytes Relative 5  3 - 12 %    Monocytes Absolute 0.4  0.1 - 1.0 K/uL    Eosinophils Relative 3  0 - 5 %    Eosinophils Absolute 0.3  0.0 - 0.7 K/uL    Basophils Relative 0  0 - 1 %    Basophils Absolute 0.0  0.0 - 0.1 K/uL   TSH     Status: Normal   Collection Time   08/28/12  8:42 PM      Component Value Range Comment   TSH 1.254  0.350 - 4.500 uIU/mL   CK TOTAL AND CKMB     Status: Normal   Collection Time   08/28/12  8:42 PM      Component Value Range Comment   Total CK 88  7 - 177 U/L    CK, MB 3.5  0.3 - 4.0 ng/mL    Relative Index RELATIVE INDEX IS INVALID  0.0 - 2.5   MRSA PCR SCREENING     Status: Abnormal   Collection Time   08/29/12  7:21 AM      Component Value Range Comment   MRSA by PCR POSITIVE (*) NEGATIVE     Dg Chest 2 View  08/28/2012  *RADIOLOGY REPORT*  Clinical Data: Bradycardia, dizziness.  CHEST - 2 VIEW  Comparison: May 27, 2011.  Findings: Cardiomediastinal silhouette appears normal.  No acute pulmonary disease is noted.  Bony thorax is intact.  IMPRESSION: No acute cardiopulmonary abnormality seen.   Original Report Authenticated By: Lupita Raider.,  M.D.     Review of Systems  Constitutional: Negative for fever and diaphoresis.  HENT: Negative for congestion and sore throat.   Eyes: Negative for blurred vision and double vision.  Respiratory: Negative for cough and shortness of breath.  Cardiovascular: Negative for chest pain, palpitations, orthopnea, leg swelling and PND.  Gastrointestinal: Negative for nausea, vomiting, abdominal pain, diarrhea, constipation, blood in stool and melena.  Genitourinary: Negative for dysuria and hematuria.  Musculoskeletal: Negative for myalgias.  Neurological: Positive for dizziness, tingling (Top of head and ringers and toes. ) and speech change (Slurred  on Holloween night.). Negative for focal weakness.   Blood pressure 98/54, pulse 92, temperature 98.4 F (36.9 C), temperature source Oral, resp. rate 13, height 5\' 1"  (1.549 m), weight 60.328 kg (133 lb), SpO2 98.00%. Physical Exam  Constitutional: She is oriented to person, place, and time. She appears well-developed and well-nourished. No distress.  HENT:  Head: Normocephalic and atraumatic.  Eyes: EOM are normal. Pupils are equal, round, and reactive to light. No scleral icterus.  Neck: Normal range of motion. Neck supple.  Cardiovascular: Normal rate and regular rhythm.   No murmur heard. Pulses:      Radial pulses are 2+ on the right side, and 2+ on the left side.       Dorsalis pedis pulses are 2+ on the right side, and 2+ on the left side.       Maybe a faint right carotid bruit  Respiratory: Effort normal and breath sounds normal. She has no wheezes. She has no rales.  GI: Soft. Bowel sounds are normal. She exhibits no distension. There is no tenderness.  Musculoskeletal: She exhibits no edema.  Lymphadenopathy:    She has no cervical adenopathy.  Neurological: She is alert and oriented to person, place, and time. No cranial nerve deficit. She exhibits normal muscle tone.  Skin: Skin is warm and dry.  Psychiatric: She has a normal mood and affect.    Assessment/Plan: Patient Active Hospital Problem List: Bradycardia (08/29/2012) Hypotension (08/29/2012)   Plan:  EKG shows NSR with rate of 89BPM.  BP improved.  It is likely dizziness is related to hypotension but should consider arrhythmia?  Will recommend 30 day monitor to assess.    HAGER, BRYAN 08/29/2012, 12:29 PM   Patient seen and examined. Agree with assessment and plan.  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  Reportedly 3 years ago patient had a negative cardiac w/u in our office (will try to obtain records). Pt admits to transient head tingling on  Halloween, recurred 1 week later, and yesterday noted dizziness and recurrent tingling. At [facility] reportedly pulse was in the upper 40's, and BP 77 for which she was treated with fluids and transferred here.  Currently BP 11/60 and P 73.  ECG this am reveals NSR at 89 with normal intervals.  PE is essentially unremarkable. Echo being done now and preliminary review does not real any significant structural abnormality. EF normal.  Minimal bowing of Anterior MV without prolapse. No AS. Pt to be transferred from SICU to telemetry. Anticipate probable dc in am with out patient monitor,   Lennette Bihari, MD, [facility] 08/29/2012 2:30 PM

## 2012-08-30 LAB — BASIC METABOLIC PANEL
CO2: 26 mEq/L (ref 19–32)
Calcium: 9 mg/dL (ref 8.4–10.5)
GFR calc non Af Amer: 88 mL/min — ABNORMAL LOW (ref >90)
Glucose, Bld: 100 mg/dL — ABNORMAL HIGH (ref 70–99)
Potassium: 3.9 mEq/L (ref 3.5–5.1)
Sodium: 135 mEq/L (ref 135–145)

## 2012-08-30 LAB — CBC
Hemoglobin: 13.7 g/dL (ref 12.0–15.0)
MCH: 29.3 pg (ref 26.0–34.0)
Platelets: 126 10*3/uL — ABNORMAL LOW (ref 150–400)
RBC: 4.67 MIL/uL (ref 3.87–5.11)
WBC: 7.3 10*3/uL (ref 4.0–10.5)

## 2012-08-30 NOTE — Progress Notes (Signed)
[facility]  Subjective: No Complaints  Objective: Vital signs in last 24 hours: Temp:  [98.1 F (36.7 C)-98.9 F (37.2 C)] 98.2 F (36.8 C) (11/10 0500) Pulse Rate:  [63-85] 63  (11/10 0500) Resp:  [12-20] 16  (11/10 0500) BP: (100-113)/(55-74) 107/67 mmHg (11/10 0500) SpO2:  [99 %-100 %] 100 % (11/10 0500) Last BM Date: 08/30/12  Intake/Output from previous day: 11/09 0701 - 11/10 0700 In: 845 [P.O.:845] Out: 1150 [Urine:1150] Intake/Output this shift: Total I/O In: 363 [P.O.:360; I.V.:3] Out: -   Medications Current Facility-Administered Medications  Medication Dose Route Frequency Provider Last Rate Last Dose  . acetaminophen (TYLENOL) tablet 650 mg  650 mg Oral Q6H PRN Domenick Roma, MD   650 mg at 08/29/12 0853   Or  . acetaminophen (TYLENOL) suppository 650 mg  650 mg Rectal Q6H PRN Domenick Roma, MD      . Chlorhexidine Gluconate Cloth 2 % PADS 6 each  6 each Topical Q0600 Bernadene Person, NP   6 each at 08/30/12 [phone]  . influenza  inactive virus vaccine (FLUZONE/FLUARIX) injection 0.5 mL  0.5 mL Intramuscular Tomorrow-1000 Domenick Roma, MD      . mupirocin ointment (BACTROBAN) 2 % 1 application  1 application Nasal BID Bernadene Person, NP   1 application at 08/30/12 [phone]  . sodium chloride 0.9 % injection 3 mL  3 mL Intravenous Q12H Domenick Roma, MD   3 mL at 08/30/12 0102    PE: General appearance: alert, cooperative and no distress Lungs: clear to auscultation bilaterally Heart: regular rate and rhythm, S1, S2 normal, no murmur, click, rub or gallop Pulses: 2+ and symmetric Neurologic: Grossly normal  Lab Results:   Basename 08/30/12 0550 08/28/12 2042 08/28/12 1250  WBC 7.3 8.0 8.5  HGB 13.7 14.2 13.1  HCT 39.9 41.0 38.1  PLT 126* 108* 134*   BMET  Basename 08/30/12 0550 08/28/12 2042 08/28/12 1250  NA 135 138 133*  K 3.9 3.9 4.1  CL 100 104 98  CO2 26 23 25   GLUCOSE 100* 76 96  BUN 10 11 14     CREATININE 0.82 0.68 0.80  CALCIUM 9.0 9.3 9.4    Assessment/Plan    Active Problems:  Bradycardia  Hypotension  Plan:  No abnormalities on tele.  BP and HR stable.  Will arrange 30 heart monitor at [facility].   LOS: 2 days   HAGER, BRYAN 08/30/2012 10:51 AM  I was not able to see the patient prior to discharge due to an emergent cardiac catheterization.  I did discuss the patient & the plan of care with Mr. Leron Croak. I agree with his findings, examination as well as impression recommendations.  30 day monitor to evaluate extent & severity of bradycardia per Dr. Landry Dyke initial recommendations. OK for d/c from cardiac standpoint.  Marykay Lex, M.D., M.S. [facility] [address] [place], [place]  [address]  [phone] Pager # [phone] 08/30/2012 2:33 PM

## 2012-08-30 NOTE — Discharge Summary (Signed)
Physician Discharge Summary  Patient ID: REDINA ZELLER MRN: [id number] DOB/AGE: 41-Apr-1972 41 y.o.  Admit date: 08/28/2012 Discharge date: 08/30/2012    Discharge Diagnoses:  Active Problems:  Bradycardia  Hypotension    Brief Summary:  41 yo F with minimal past medical history transferred 11/8 from [facility] with bradycardia and hypotension. Yesterday evening she developed fatigue and dizziness. She drank several glasses of fluids and went to bed. This AM she continued to have symptoms and saw here PCP who sent her to the [facility] for IVF given SBP in 80s. There she was noted to have a HR in the 40s and SBP in the 70s-80s. She received IVF with modest improvement and was transferred to [facility].    Consults: Cardiology   Lines/tubes: none  Microbiology/Sepsis markers: none  Significant Diagnostic Studies:  2D echo 11/9>>> EF 55-60%.  LV normal. No significant valvular abnormalities.                                                                     Hospital Summary by Discharge Diagnosis   Bradycardia / Hypotension -- unknown etiology.  Cardiac w/u for chest pain 3 years ago unremarkable.  HR, BP now wnl with minimal intervention (gentle IV fluids).  NSR.  2D echo normal.  Pt followed by cardiology [facility]) who are ok with d/c.  They will arrange outpt monitor. At time of d/c bp 113/74 and HR 65-75 NSR.   Dizziness/ Fatigue - likely r/t above.  Resolved.   Filed Vitals:   08/29/12 1616 08/29/12 1809 08/29/12 2100 08/30/12 0500  BP:  113/74 108/66 107/67  Pulse:  85 74 63  Temp: 98.9 F (37.2 C) 98.3 F (36.8 C) 98.1 F (36.7 C) 98.2 F (36.8 C)  TempSrc: Oral Oral    Resp:  18 18 16   Height:      Weight:      SpO2:  100% 100% 100%     Discharge Labs  BMET  Lab 08/30/12 0550 08/28/12 2042 08/28/12 1250  NA 135 138 133*  K 3.9 3.9 --  CL 100 104 98  CO2 26 23 25   GLUCOSE 100* 76 96  BUN 10 11 14   CREATININE 0.82 0.68 0.80    CALCIUM 9.0 9.3 9.4  MG -- -- --  PHOS -- -- --     CBC   Lab 08/30/12 0550 08/28/12 2042 08/28/12 1250  HGB 13.7 14.2 13.1  HCT 39.9 41.0 38.1  WBC 7.3 8.0 8.5  PLT 126* 108* 134*          Follow-up Information    Follow up with Lennette Bihari, MD. ([facility] call you to arrange heart monitor and follow up)    Contact information:   [address] [place] [address] [phone]       Follow up with Nadean Corwin, MD. Schedule an appointment as soon as possible for a visit in 2 weeks.   Contact information:   [address] [place] [address] [phone]             Medication List     As of 08/30/2012  1:51 PM    CONTINUE taking these medications  butalbital-acetaminophen-caffeine 50-325-40 MG per tablet   Commonly known as: FIORICET, ESGIC      ibuprofen 200 MG tablet   Commonly known as: ADVIL,MOTRIN      levonorgestrel 20 MCG/24HR IUD   Commonly known as: MIRENA          Disposition: Home   Discharged Condition: TRISTYNN COUPAL has met maximum benefit of inpatient care and is medically stable and cleared for discharge.  Patient is pending follow up as above.      Time spent on disposition:  Greater than 35 minutes.   SignedDanford Bad, NP 08/30/2012  1:51 PM Pager: [phone] or [phone]  *Care during the described time interval was provided by me and/or other providers on the critical care team. I have reviewed this patient's available data, including medical history, events of note, physical examination and test results as part of my evaluation.  Suzie Portela, MD  [phone]

## 2012-09-01 NOTE — Progress Notes (Signed)
Utilization review completed- retro 

## 2012-09-01 NOTE — ED Provider Notes (Signed)
Medical screening examination/treatment/procedure(s) were performed by non-physician practitioner and as supervising physician I was immediately available for consultation/collaboration.  Ethelda Chick, MD 09/01/12 [phone]

## 2012-12-24 ENCOUNTER — Encounter: Payer: Self-pay | Admitting: Cardiology

## 2013-01-15 ENCOUNTER — Encounter: Payer: Self-pay | Admitting: Cardiovascular Disease

## 2014-05-24 ENCOUNTER — Ambulatory Visit (INDEPENDENT_AMBULATORY_CARE_PROVIDER_SITE_OTHER): Payer: Commercial Managed Care - PPO | Admitting: Physician Assistant

## 2014-05-24 ENCOUNTER — Encounter: Payer: Self-pay | Admitting: Physician Assistant

## 2014-05-24 VITALS — BP 110/70 | HR 88 | Temp 97.9°F | Resp 16 | Ht 61.0 in | Wt 135.0 lb

## 2014-05-24 DIAGNOSIS — R1032 Left lower quadrant pain: Secondary | ICD-10-CM

## 2014-05-24 DIAGNOSIS — K5900 Constipation, unspecified: Secondary | ICD-10-CM

## 2014-05-24 LAB — CBC WITH DIFFERENTIAL/PLATELET
BASOS ABS: 0 10*3/uL (ref 0.0–0.1)
BASOS PCT: 0 % (ref 0–1)
EOS ABS: 0.3 10*3/uL (ref 0.0–0.7)
Eosinophils Relative: 5 % (ref 0–5)
HCT: 40.4 % (ref 36.0–46.0)
Hemoglobin: 14.2 g/dL (ref 12.0–15.0)
Lymphocytes Relative: 28 % (ref 12–46)
Lymphs Abs: 1.8 10*3/uL (ref 0.7–4.0)
MCH: 29.1 pg (ref 26.0–34.0)
MCHC: 35.1 g/dL (ref 30.0–36.0)
MCV: 82.8 fL (ref 78.0–100.0)
Monocytes Absolute: 0.8 10*3/uL (ref 0.1–1.0)
Monocytes Relative: 12 % (ref 3–12)
NEUTROS PCT: 55 % (ref 43–77)
Neutro Abs: 3.6 10*3/uL (ref 1.7–7.7)
PLATELETS: 153 10*3/uL (ref 150–400)
RBC: 4.88 MIL/uL (ref 3.87–5.11)
RDW: 14.1 % (ref 11.5–15.5)
WBC: 6.6 10*3/uL (ref 4.0–10.5)

## 2014-05-24 NOTE — Patient Instructions (Signed)
Benefiber is good for constipation/diarrhea/irritable bowel syndrome, it helps with weight loss and can help lower your bad cholesterol. Please do 1-2 TBSP in the morning in water, coffee, or tea. It can take up to a month before you can see a difference with your bowel movements. It is cheapest from [employer], [employer], [employer].   Constipation Constipation is when a person has fewer than three bowel movements a week, has difficulty having a bowel movement, or has stools that are dry, hard, or larger than normal. As people grow older, constipation is more common. If you try to fix constipation with medicines that make you have a bowel movement (laxatives), the problem may get worse. Long-term laxative use may cause the muscles of the colon to become weak. A low-fiber diet, not taking in enough fluids, and taking certain medicines may make constipation worse.  CAUSES   Certain medicines, such as antidepressants, pain medicine, iron supplements, antacids, and water pills.   Certain diseases, such as diabetes, irritable bowel syndrome (IBS), thyroid disease, or depression.   Not drinking enough water.   Not eating enough fiber-rich foods.   Stress or travel.   Lack of physical activity or exercise.   Ignoring the urge to have a bowel movement.   Using laxatives too much.  SIGNS AND SYMPTOMS   Having fewer than three bowel movements a week.   Straining to have a bowel movement.   Having stools that are hard, dry, or larger than normal.   Feeling full or bloated.   Pain in the lower abdomen.   Not feeling relief after having a bowel movement.  DIAGNOSIS  Your health care provider will take a medical history and perform a physical exam. Further testing may be done for severe constipation. Some tests may include:  A barium enema X-ray to examine your rectum, colon, and, sometimes, your small intestine.   A sigmoidoscopy to examine your lower colon.   A colonoscopy to  examine your entire colon. TREATMENT  Treatment will depend on the severity of your constipation and what is causing it. Some dietary treatments include drinking more fluids and eating more fiber-rich foods. Lifestyle treatments may include regular exercise. If these diet and lifestyle recommendations do not help, your health care provider may recommend taking over-the-counter laxative medicines to help you have bowel movements. Prescription medicines may be prescribed if over-the-counter medicines do not work.  HOME CARE INSTRUCTIONS   Eat foods that have a lot of fiber, such as fruits, vegetables, whole grains, and beans.  Limit foods high in fat and processed sugars, such as french fries, hamburgers, cookies, candies, and soda.   A fiber supplement may be added to your diet if you cannot get enough fiber from foods.   Drink enough fluids to keep your urine clear or pale yellow.   Exercise regularly or as directed by your health care provider.   Go to the restroom when you have the urge to go. Do not hold it.   Only take over-the-counter or prescription medicines as directed by your health care provider. Do not take other medicines for constipation without talking to your health care provider first.  SEEK IMMEDIATE MEDICAL CARE IF:   You have bright red blood in your stool.   Your constipation lasts for more than 4 days or gets worse.   You have abdominal or rectal pain.   You have thin, pencil-like stools.   You have unexplained weight loss. MAKE SURE YOU:   Understand these instructions.  Will watch your condition.  Will get help right away if you are not doing well or get worse. Document Released: 07/05/2004 Document Revised: 10/12/2013 Document Reviewed: 07/19/2013 [facility] Patient Information 2015 [place], [place]. This information is not intended to replace advice given to you by your health care provider. Make sure you discuss any questions you have with your  health care provider.

## 2014-05-24 NOTE — Progress Notes (Signed)
   Subjective:    Patient ID: Tammy PrimusAngela C Gibbs, female    DOB: 01/23/1971, 43 y.o.   MRN: [id number]  HPI 43 y.o. female with history of constipation presents with nausea, mucus, and LLQ discomfort. On Friday she had diarrhea, then on Saturday she was having fluctuance with just mucus no blood and then today she had some stool with mucus. She has been having cramping, LLQ pain, and nausea with food. Denies fever, chills, weight loss. Went on a cruise in middle of July, no symptoms previously. Dad with polyps and both parents with diverticulitis.    Review of Systems  Constitutional: Negative.   HENT: Negative.   Respiratory: Negative.   Cardiovascular: Negative.   Gastrointestinal: Positive for nausea, abdominal pain and constipation. Negative for vomiting, diarrhea, blood in stool, abdominal distention, anal bleeding and rectal pain.  Genitourinary: Negative.   Neurological: Negative.        Objective:   Physical Exam  Vitals reviewed. Constitutional: She is oriented to person, place, and time. She appears well-developed and well-nourished.  Neck: Normal range of motion. Neck supple.  Cardiovascular: Normal rate and regular rhythm.   Pulmonary/Chest: Effort normal and breath sounds normal.  Abdominal: Soft. Bowel sounds are normal. She exhibits no mass. There is tenderness in the right lower quadrant and left lower quadrant. There is guarding. There is no rigidity, no rebound, no tenderness at McBurney's point and negative Murphy's sign.  Neurological: She is alert and oriented to person, place, and time.  Skin: Skin is warm and dry. No rash noted.       Assessment & Plan:  Constipation/mucus with benign AB exam, likely IBS- check labs, get on Benefiber, try probiotic samples, bland food, increase water, can try linzess if not better. Follow up 1 month.

## 2014-05-25 LAB — HEPATIC FUNCTION PANEL
ALBUMIN: 3.9 g/dL (ref 3.5–5.2)
ALT: 13 U/L (ref 0–35)
AST: 19 U/L (ref 0–37)
Alkaline Phosphatase: 58 U/L (ref 39–117)
BILIRUBIN TOTAL: 0.5 mg/dL (ref 0.2–1.2)
Bilirubin, Direct: 0.1 mg/dL (ref 0.0–0.3)
Indirect Bilirubin: 0.4 mg/dL (ref 0.2–1.2)
Total Protein: 6.9 g/dL (ref 6.0–8.3)

## 2014-05-25 LAB — BASIC METABOLIC PANEL WITH GFR
BUN: 14 mg/dL (ref 6–23)
CHLORIDE: 103 meq/L (ref 96–112)
CO2: 28 mEq/L (ref 19–32)
Calcium: 9.2 mg/dL (ref 8.4–10.5)
Creat: 0.79 mg/dL (ref 0.50–1.10)
GFR, Est African American: >89 mL/min
Glucose, Bld: 76 mg/dL (ref 70–99)
POTASSIUM: 4.5 meq/L (ref 3.5–5.3)
Sodium: 139 mEq/L (ref 135–145)

## 2014-05-25 LAB — SEDIMENTATION RATE: SED RATE: 4 mm/h (ref 0–22)

## 2014-05-31 ENCOUNTER — Ambulatory Visit (INDEPENDENT_AMBULATORY_CARE_PROVIDER_SITE_OTHER): Payer: Commercial Managed Care - PPO | Admitting: Internal Medicine

## 2014-05-31 ENCOUNTER — Encounter: Payer: Self-pay | Admitting: Internal Medicine

## 2014-05-31 VITALS — BP 106/68 | HR 60 | Temp 98.1°F | Resp 16 | Ht 61.75 in | Wt 139.8 lb

## 2014-05-31 DIAGNOSIS — M545 Low back pain, unspecified: Secondary | ICD-10-CM

## 2014-05-31 DIAGNOSIS — M5432 Sciatica, left side: Secondary | ICD-10-CM

## 2014-05-31 DIAGNOSIS — R51 Headache: Secondary | ICD-10-CM | POA: Insufficient documentation

## 2014-05-31 DIAGNOSIS — M5430 Sciatica, unspecified side: Secondary | ICD-10-CM

## 2014-05-31 DIAGNOSIS — R519 Headache, unspecified: Secondary | ICD-10-CM | POA: Insufficient documentation

## 2014-05-31 MED ORDER — HYDROCODONE-ACETAMINOPHEN 5-325 MG PO TABS: ORAL_TABLET | ORAL | Status: DC

## 2014-05-31 MED ORDER — PREDNISONE 20 MG PO TABS: ORAL_TABLET | ORAL | Status: DC

## 2014-05-31 NOTE — Patient Instructions (Signed)
Sciatica Sciatica is pain, weakness, numbness, or tingling along the path of the sciatic nerve. The nerve starts in the lower back and runs down the back of each leg. The nerve controls the muscles in the lower leg and in the back of the knee, while also providing sensation to the back of the thigh, lower leg, and the sole of your foot. Sciatica is a symptom of another medical condition. For instance, nerve damage or certain conditions, such as a herniated disk or bone spur on the spine, pinch or put pressure on the sciatic nerve. This causes the pain, weakness, or other sensations normally associated with sciatica. Generally, sciatica only affects one side of the body. CAUSES   Herniated or slipped disc.  Degenerative disk disease.  A pain disorder involving the narrow muscle in the buttocks (piriformis syndrome).  Pelvic injury or fracture.  Pregnancy.  Tumor (rare). SYMPTOMS  Symptoms can vary from mild to very severe. The symptoms usually travel from the low back to the buttocks and down the back of the leg. Symptoms can include:  Mild tingling or dull aches in the lower back, leg, or hip.  Numbness in the back of the calf or sole of the foot.  Burning sensations in the lower back, leg, or hip.  Sharp pains in the lower back, leg, or hip.  Leg weakness.  Severe back pain inhibiting movement. These symptoms may get worse with coughing, sneezing, laughing, or prolonged sitting or standing. Also, being overweight may worsen symptoms. DIAGNOSIS  Your caregiver will perform a physical exam to look for common symptoms of sciatica. He or she may ask you to do certain movements or activities that would trigger sciatic nerve pain. Other tests may be performed to find the cause of the sciatica. These may include:  Blood tests.  X-rays.  Imaging tests, such as an MRI or CT scan. TREATMENT  Treatment is directed at the cause of the sciatic pain. Sometimes, treatment is not necessary  and the pain and discomfort goes away on its own. If treatment is needed, your caregiver may suggest:  Over-the-counter medicines to relieve pain.  Prescription medicines, such as anti-inflammatory medicine, muscle relaxants, or narcotics.  Applying heat or ice to the painful area.  Steroid injections to lessen pain, irritation, and inflammation around the nerve.  Reducing activity during periods of pain.  Exercising and stretching to strengthen your abdomen and improve flexibility of your spine. Your caregiver may suggest losing weight if the extra weight makes the back pain worse.  Physical therapy.  Surgery to eliminate what is pressing or pinching the nerve, such as a bone spur or part of a herniated disk. HOME CARE INSTRUCTIONS   Only take over-the-counter or prescription medicines for pain or discomfort as directed by your caregiver.  Apply ice to the affected area for 20 minutes, 3-4 times a day for the first 48-72 hours. Then try heat in the same way.  Exercise, stretch, or perform your usual activities if these do not aggravate your pain.  Attend physical therapy sessions as directed by your caregiver.  Keep all follow-up appointments as directed by your caregiver.  Do not wear high heels or shoes that do not provide proper support.  Check your mattress to see if it is too soft. A firm mattress may lessen your pain and discomfort. SEEK IMMEDIATE MEDICAL CARE IF:   You lose control of your bowel or bladder (incontinence).  You have increasing weakness in the lower back, pelvis, buttocks,   or legs.  You have redness or swelling of your back.  You have a burning sensation when you urinate.  You have pain that gets worse when you lie down or awakens you at night.  Your pain is worse than you have experienced in the past.  Your pain is lasting longer than 4 weeks.  You are suddenly losing weight without reason. MAKE SURE YOU:  Understand these  instructions.  Will watch your condition.  Will get help right away if you are not doing well or get worse. Document Released: 10/01/2001 Document Revised: 04/07/2012 Document Reviewed: 02/16/2012 Exit[employer] Patient Information 2015 [employer]. This information is not intended to replace advice given to you by your health care provider. Make sure you discuss any questions you have with your health care provider.  

## 2014-05-31 NOTE — Progress Notes (Signed)
   Subjective:    Patient ID: Tammy PrimusAngela C Gibbs, female    DOB: 04/05/1971, 43 y.o.   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   Medication List   BENEFIBER Powd  Take by mouth daily.     ibuprofen 200 MG tablet  Commonly known as:  ADVIL,MOTRIN  Take 200 mg by mouth every 6 (six) hours as needed. For head aches     levonorgestrel 20 MCG/24HR IUD  Commonly known as:  MIRENA  1 each by Intrauterine route once.     No Known Allergies  Past Medical History  Diagnosis Date  . Pregnancy with history of caesarean section, antepartum   . Clavicular fracture   . Chest pain   . Scarlet fever   . Stress headaches   . H/O orthostatic hypotension 07/2012    last Echo11/2013 EF 55-60%-normal  . Bradycardia 07/2012    Last Niuc 07/2008 no ischemia, event monitor- no significant arrhy.   Review of Systems In addition to the HPI above,  No Fever-chills,  No Headache, No changes with Vision or hearing,  No problems swallowing food or Liquids,  No Chest pain or productive Cough or Shortness of Breath,  No Abdominal pain, No Nausea or Vomitting, Bowel movements are regular,  No Blood in stool or Urine,  No dysuria,  No new skin rashes or bruises,  No new joints pains-aches,  No new weakness, tingling, numbness in any extremity,  No recent weight loss,  No polyuria, polydypsia or polyphagia,  No significant Mental Stressors.  A full 10 point Review of Systems was done, except as stated above, all other Review of Systems were negative  Objective:   Physical Exam BP 106/68  Pulse 60  Temp(Src) 98.1 F (36.7 C) (Temporal)  Resp 16  Ht 5' 1.75" (1.568 m)  Wt 139 lb 12.8 oz (63.413 kg)  BMI 25.79 kg/m2  HEENT - Eac's patent. TM's Nl. EOM's full. PERRLA. NasoOroPharynx clear. Neck - supple. Nl Thyroid. Carotids 2+ & No bruits, nodes, JVD Chest - Clear equal BS w/o Rales, rhonchi, wheezes. Cor - Nl HS. RRR w/o sig  MGR. PP 1(+). No edema. MS- FROM w/o deformities. Muscle power, tone and bulk Nl. Sl Limp. (+) SLR Lt>>Rt.  Neuro - No obvious Cr N abnormalities. Sensory, motor and Cerebellar functions appear Nl w/o focal abnormalities.  Assessment & Plan:   1. Sciatica neuralgia, left  2. Left low back pain, with sciatica presence unspecified  - Rx Prednisone 20 mg #20 Pulse/Taper- Norco 5 #50 & Sx's Lyrica 50 mg #21 - Advised to call for OOW note if needed

## 2014-10-23 ENCOUNTER — Encounter (HOSPITAL_COMMUNITY): Payer: Self-pay | Admitting: Emergency Medicine

## 2014-10-23 ENCOUNTER — Emergency Department (HOSPITAL_COMMUNITY): Payer: PRIVATE HEALTH INSURANCE

## 2014-10-23 ENCOUNTER — Other Ambulatory Visit: Payer: Self-pay

## 2014-10-23 ENCOUNTER — Emergency Department (HOSPITAL_COMMUNITY)
Admission: EM | Admit: 2014-10-23 | Discharge: 2014-10-23 | Disposition: A | Discharge status: Home / Self Care | Payer: PRIVATE HEALTH INSURANCE | Attending: Emergency Medicine | Admitting: Emergency Medicine

## 2014-10-23 DIAGNOSIS — Z3202 Encounter for pregnancy test, result negative: Secondary | ICD-10-CM | POA: Diagnosis not present

## 2014-10-23 DIAGNOSIS — Z79899 Other long term (current) drug therapy: Secondary | ICD-10-CM | POA: Diagnosis not present

## 2014-10-23 DIAGNOSIS — Z8679 Personal history of other diseases of the circulatory system: Secondary | ICD-10-CM | POA: Insufficient documentation

## 2014-10-23 DIAGNOSIS — Z8619 Personal history of other infectious and parasitic diseases: Secondary | ICD-10-CM | POA: Insufficient documentation

## 2014-10-23 DIAGNOSIS — Z7952 Long term (current) use of systemic steroids: Secondary | ICD-10-CM | POA: Insufficient documentation

## 2014-10-23 DIAGNOSIS — R000 Tachycardia, unspecified: Secondary | ICD-10-CM | POA: Diagnosis present

## 2014-10-23 DIAGNOSIS — Z8781 Personal history of (healed) traumatic fracture: Secondary | ICD-10-CM | POA: Diagnosis not present

## 2014-10-23 DIAGNOSIS — R079 Chest pain, unspecified: Secondary | ICD-10-CM | POA: Diagnosis not present

## 2014-10-23 LAB — URINALYSIS, ROUTINE W REFLEX MICROSCOPIC
BILIRUBIN URINE: NEGATIVE
GLUCOSE, UA: 100 mg/dL — AB
HGB URINE DIPSTICK: NEGATIVE
KETONES UR: NEGATIVE mg/dL
Leukocytes, UA: NEGATIVE
Nitrite: NEGATIVE
PH: 6 (ref 5.0–8.0)
PROTEIN: NEGATIVE mg/dL
Specific Gravity, Urine: 1.01 (ref 1.005–1.030)
Urobilinogen, UA: 0.2 mg/dL (ref 0.0–1.0)

## 2014-10-23 LAB — CBC WITH DIFFERENTIAL/PLATELET
BASOS ABS: 0 10*3/uL (ref 0.0–0.1)
Basophils Relative: 0 % (ref 0–1)
Eosinophils Absolute: 0.4 10*3/uL (ref 0.0–0.7)
Eosinophils Relative: 4 % (ref 0–5)
HEMATOCRIT: 42.7 % (ref 36.0–46.0)
HEMOGLOBIN: 14.5 g/dL (ref 12.0–15.0)
LYMPHS PCT: 26 % (ref 12–46)
Lymphs Abs: 2.2 10*3/uL (ref 0.7–4.0)
MCH: 29 pg (ref 26.0–34.0)
MCHC: 34 g/dL (ref 30.0–36.0)
MCV: 85.4 fL (ref 78.0–100.0)
MONO ABS: 0.5 10*3/uL (ref 0.1–1.0)
Monocytes Relative: 5 % (ref 3–12)
NEUTROS ABS: 5.3 10*3/uL (ref 1.7–7.7)
NEUTROS PCT: 65 % (ref 43–77)
Platelets: 141 10*3/uL — ABNORMAL LOW (ref 150–400)
RBC: 5 MIL/uL (ref 3.87–5.11)
RDW: 13.3 % (ref 11.5–15.5)
WBC: 8.3 10*3/uL (ref 4.0–10.5)

## 2014-10-23 LAB — COMPREHENSIVE METABOLIC PANEL
ALK PHOS: 68 U/L (ref 39–117)
ALT: 20 U/L (ref 0–35)
AST: 30 U/L (ref 0–37)
Albumin: 4.1 g/dL (ref 3.5–5.2)
Anion gap: 10 (ref 5–15)
BILIRUBIN TOTAL: 0.6 mg/dL (ref 0.3–1.2)
BUN: 11 mg/dL (ref 6–23)
CHLORIDE: 101 meq/L (ref 96–112)
CO2: 24 mmol/L (ref 19–32)
CREATININE: 1.01 mg/dL (ref 0.50–1.10)
Calcium: 9.6 mg/dL (ref 8.4–10.5)
GFR calc Af Amer: 78 mL/min — ABNORMAL LOW (ref >90)
GFR, EST NON AFRICAN AMERICAN: 67 mL/min — AB (ref >90)
Glucose, Bld: 142 mg/dL — ABNORMAL HIGH (ref 70–99)
POTASSIUM: 3.6 mmol/L (ref 3.5–5.1)
Sodium: 135 mmol/L (ref 135–145)
Total Protein: 7.7 g/dL (ref 6.0–8.3)

## 2014-10-23 LAB — POC URINE PREG, ED: Preg Test, Ur: NEGATIVE

## 2014-10-23 LAB — TROPONIN I: Troponin I: <0.03 ng/mL (ref <0.031)

## 2014-10-23 NOTE — ED Provider Notes (Signed)
CSN: [id number]     Arrival date & time 10/23/14  1506 History   First MD Initiated Contact with Patient 10/23/14 1509     Chief Complaint  Patient presents with  . Tachycardia      HPI  Patient resents with palpitations, lightheadedness. Symptoms began suddenly, approximately 2 hours ago. Since onset symptoms have improved spontaneously. No clear intervention. Patient was generally well prior to the onset of symptoms. She does acknowledge substantial life stress. Patient drank a typical amount of caffeine today, no other recent dietary changes, medication changes. No recent travel. Patient does not smoke. Patient is one similar prior episode, several years ago, requiring admission with monitoring. During that episode the patient had tachycardia, bradycardia, was monitored. There were no discharge changes in medication. No interval episodes.  Past Medical History  Diagnosis Date  . Pregnancy with history of caesarean section, antepartum   . Clavicular fracture   . Chest pain   . Scarlet fever   . Stress headaches   . H/O orthostatic hypotension 07/2012    last Echo11/2013 EF 55-60%-normal  . Bradycardia 07/2012    Last Niuc 07/2008 no ischemia, event monitor- no significant arrhy.   Past Surgical History  Procedure Laterality Date  . Cesarean section  1995, 2006, 2007   Family History  Problem Relation Age of Onset  . Hypertension Mother   . Hyperlipidemia Mother   . Hypertension Father   . Hyperlipidemia Father   . Atrial fibrillation Other   . Hyperlipidemia Other   . Hypertension Other   . Coronary artery disease Other    History  Substance Use Topics  . Smoking status: Never Smoker   . Smokeless tobacco: Never Used  . Alcohol Use: 0.5 oz/week    1 drink(s) per week   OB History    No data available     Review of Systems  Constitutional:       Per HPI, otherwise negative  HENT:       Per HPI, otherwise negative  Respiratory:       Per HPI,  otherwise negative  Cardiovascular:       Per HPI, otherwise negative  Gastrointestinal: Negative for vomiting.  Endocrine:       Negative aside from HPI  Genitourinary:       Neg aside from HPI   Musculoskeletal:       Per HPI, otherwise negative  Skin: Negative.   Neurological: Positive for weakness and light-headedness. Negative for syncope.      Allergies  Review of patient's allergies indicates no known allergies.  Home Medications   Prior to Admission medications   Medication Sig Start Date End Date Taking? Authorizing Provider  HYDROcodone-acetaminophen (NORCO) 5-325 MG per tablet Take 1/2 to 1 tablet every 3 to 4 hours as needed for pain 05/31/14   Lucky Cowboy, MD  ibuprofen (ADVIL,MOTRIN) 200 MG tablet Take 200 mg by mouth every 6 (six) hours as needed. For head aches    Historical Provider, MD  levonorgestrel (MIRENA) 20 MCG/24HR IUD 1 each by Intrauterine route once.    Historical Provider, MD  predniSONE (DELTASONE) 20 MG tablet 1 tab 3 x day for 3 days, then 1 tab 2 x day for 3 days, then 1 tab 1 x day for 5 days 05/31/14   Lucky Cowboy, MD  Wheat Dextrin [facility]) POWD Take by mouth daily.    Historical Provider, MD   BP 100/74 mmHg  Pulse 101  Temp(Src) 98.1 F (36.7  C) (Oral)  Resp 19  Ht  (1.549 m)  Wt 137 lb (62.143 kg)  BMI 25.90 kg/m2  SpO2 100% Physical Exam  Constitutional: She is oriented to person, place, and time. She appears well-developed and well-nourished. No distress.  HENT:  Head: Normocephalic and atraumatic.  Eyes: Conjunctivae and EOM are normal.  Cardiovascular: Normal rate and regular rhythm.   Pulmonary/Chest: Effort normal and breath sounds normal. No stridor. No respiratory distress.  Abdominal: She exhibits no distension.  Musculoskeletal: She exhibits no edema or tenderness.  Neurological: She is alert and oriented to person, place, and time. No cranial nerve deficit.  Skin: Skin is warm and dry. No rash noted. No  erythema.  Psychiatric: She has a normal mood and affect. Her behavior is normal.  Nursing note and vitals reviewed.   ED Course  Procedures (including critical care time) Labs Review Labs Reviewed  CBC WITH DIFFERENTIAL - Abnormal; Notable for the following:    Platelets 141 (*)    All other components within normal limits  COMPREHENSIVE METABOLIC PANEL - Abnormal; Notable for the following:    Glucose, Bld 142 (*)    GFR calc non Af Amer 67 (*)    GFR calc Af Amer 78 (*)    All other components within normal limits  URINALYSIS, ROUTINE W REFLEX MICROSCOPIC - Abnormal; Notable for the following:    APPearance CLOUDY (*)    Glucose, UA 100 (*)    All other components within normal limits  TROPONIN I  POC URINE PREG, ED    Imaging Review Dg Chest 2 View  10/23/2014   CLINICAL DATA:  Chest pain and tachycardia today.  EXAM: CHEST  2 VIEW  COMPARISON:  08/28/2012  FINDINGS: The heart size and mediastinal contours are within normal limits. Both lungs are clear. The visualized skeletal structures are unremarkable.  IMPRESSION: Normal chest x-ray.   Electronically Signed   By: Loralie Champagne M.D.   On: 10/23/2014 16:07    EKG with sinus tachycardia, 109, diffuse ST depressions, abnormal  I discussed the patient with her PMD.  On repeat exam the patient has no complains, is in no distress.  We discussed all findings, including benefits of admission versus outpatient evaluation.  Patient has a preference for outpatient evaluation.  Given the reassuring findings, the similar prior episode, this seems reasonable.   MDM   Final diagnoses:  Chest pain    Patient presents with lightheadedness, tachycardia, palpitations. Patient is awake, alert, in no distress throughout her emergency department course. Patient's evaluation is reassuring. Patient has had one similar prior episode, also without clear etiology, but with similar symptoms and findings. Givens reassuring findings, the  patient was discharged in stable condition to follow-up with primary care.    Gerhard Munch, MD 10/23/14 1714

## 2014-10-23 NOTE — ED Notes (Signed)
Pt c/o lightheadedness, palpitations, and SOB.  Pt monitored HR and found HR of 135 with irregular rhythm.  Denies any chest pain, n/v.

## 2014-10-23 NOTE — Discharge Instructions (Signed)
As discussed, your evaluation today has been largely reassuring.  But, it is important that you monitor your condition carefully, and do not hesitate to return to the ED if you develop new, or concerning changes in your condition. ? ?Otherwise, please follow-up with your physician for appropriate ongoing care. ? ?

## 2015-07-29 ENCOUNTER — Encounter: Payer: Self-pay | Admitting: *Deleted

## 2015-09-04 ENCOUNTER — Other Ambulatory Visit: Payer: Self-pay | Admitting: Internal Medicine

## 2015-11-12 IMAGING — DX DG CHEST 2V
2 series · 2 of 2 positions shown · non-contrast
Comparison: 08/28/2012

CLINICAL DATA: Chest pain and tachycardia today.

EXAM:
CHEST  2 VIEW

[chest pa]
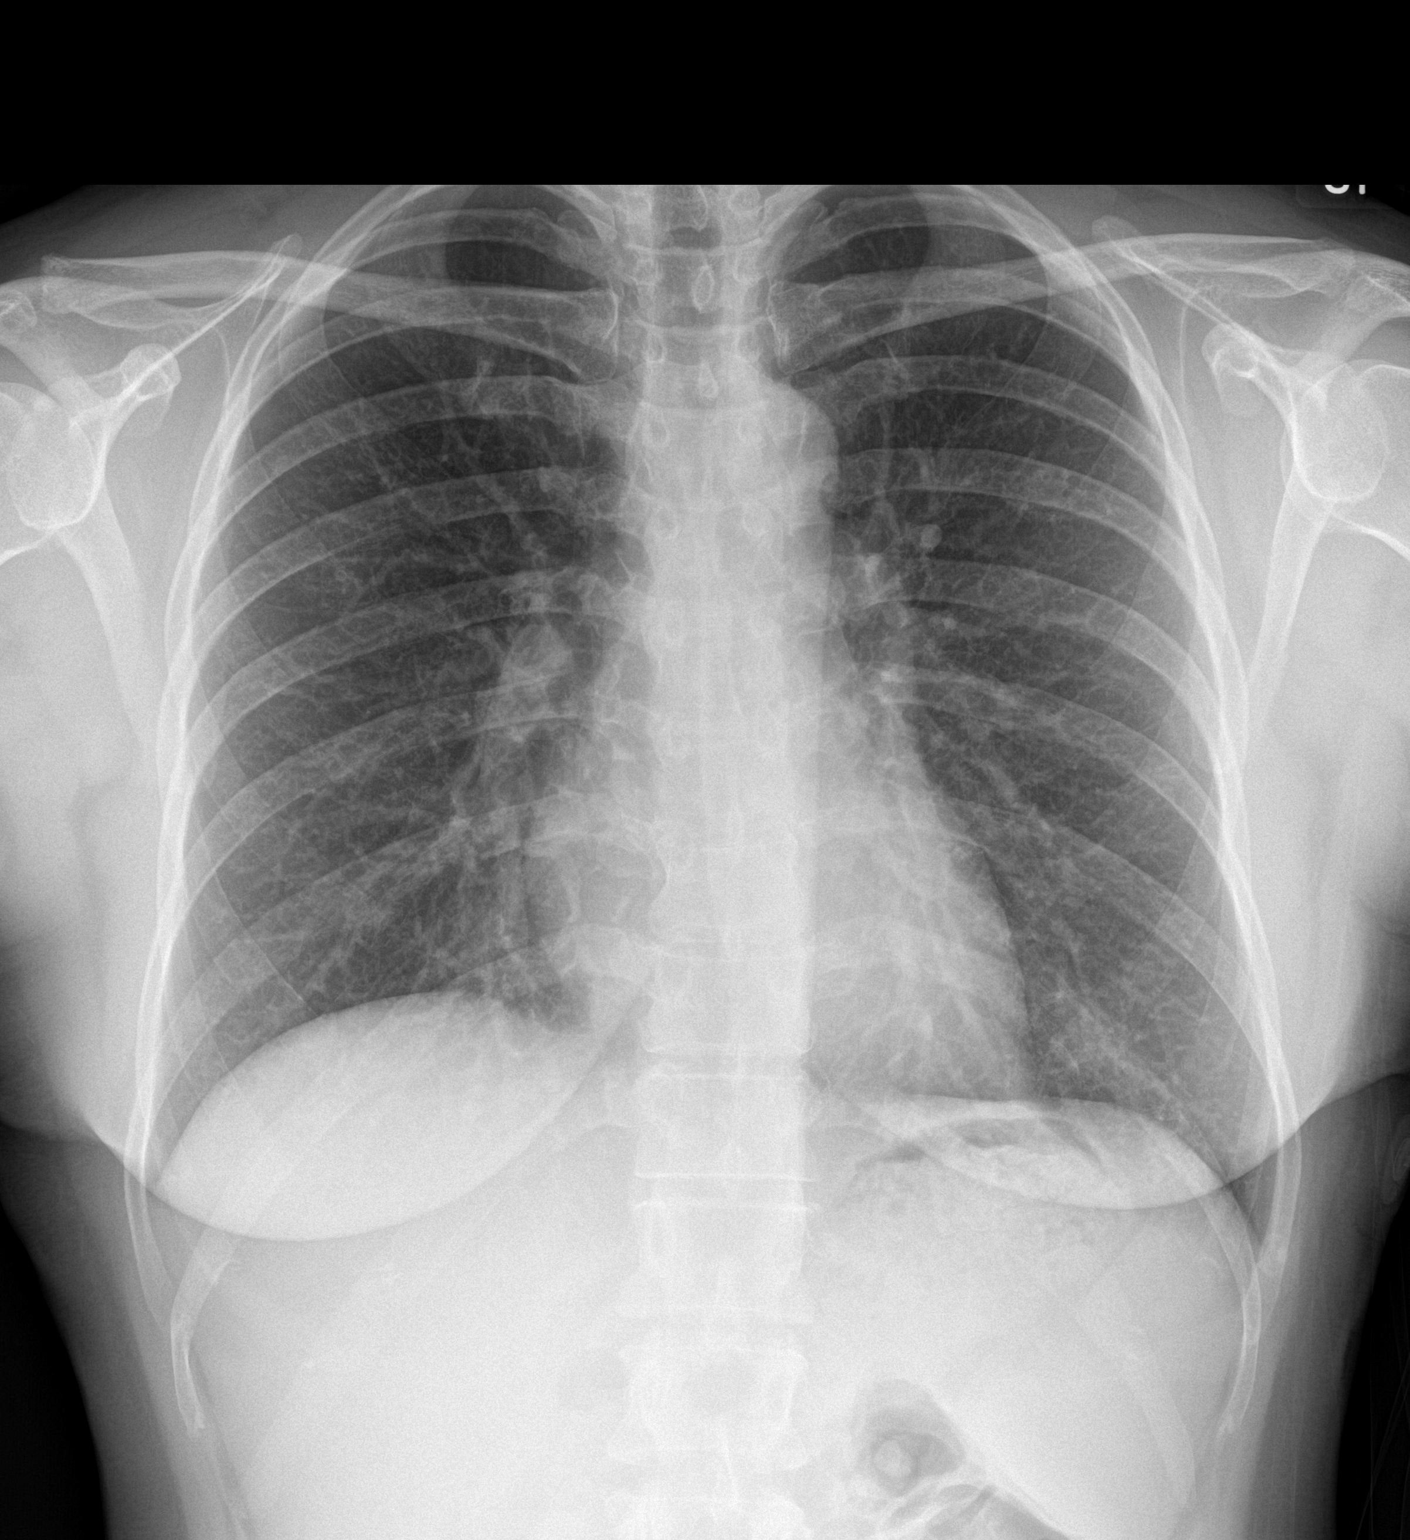

[chest lat]
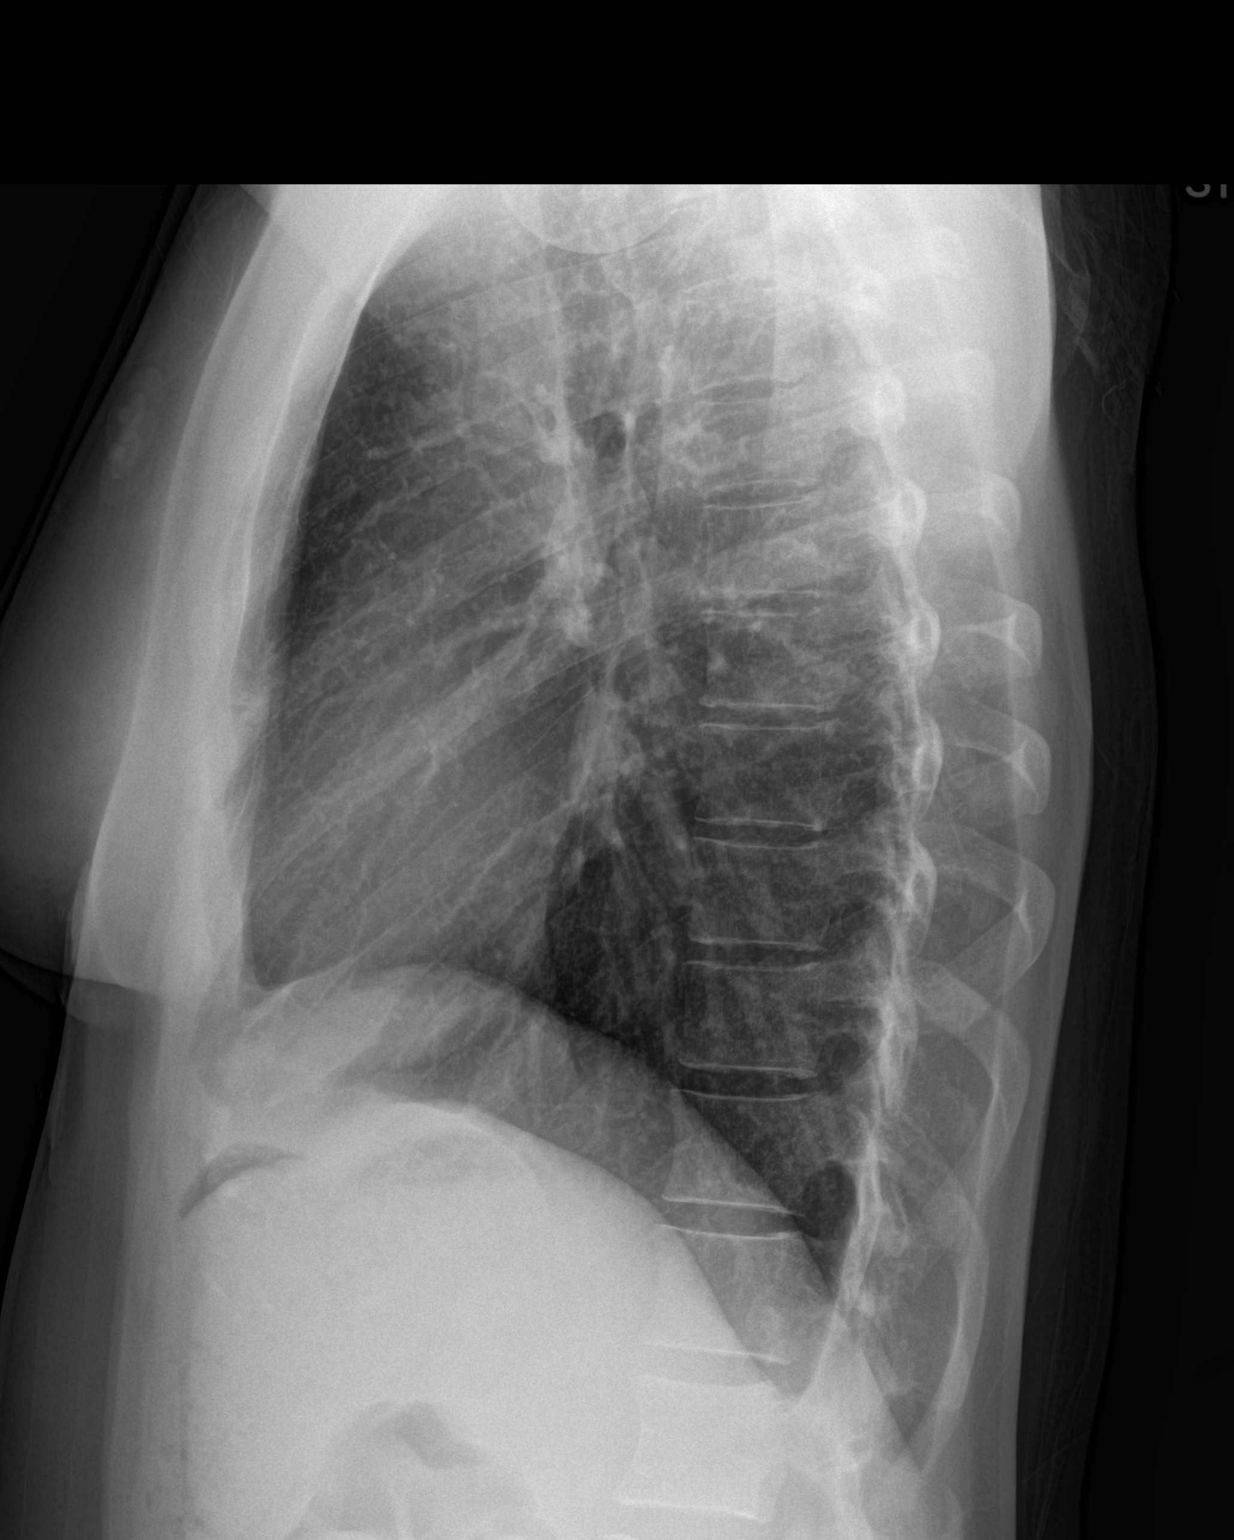

[2 of 2 positions shown; findings below may reference images not displayed]

FINDINGS: The heart size and mediastinal contours are within normal limits.
Both lungs are clear. The visualized skeletal structures are
unremarkable.
IMPRESSION: Normal chest x-ray.

## 2015-12-05 ENCOUNTER — Encounter: Payer: Self-pay | Admitting: Internal Medicine

## 2015-12-05 ENCOUNTER — Ambulatory Visit (INDEPENDENT_AMBULATORY_CARE_PROVIDER_SITE_OTHER): Payer: Federal, State, Local not specified - PPO | Admitting: Internal Medicine

## 2015-12-05 VITALS — BP 132/80 | HR 100 | Temp 98.2°F | Resp 18 | Ht 61.75 in | Wt 151.0 lb

## 2015-12-05 DIAGNOSIS — J069 Acute upper respiratory infection, unspecified: Secondary | ICD-10-CM

## 2015-12-05 MED ORDER — MOMETASONE FUROATE 50 MCG/ACT NA SUSP: 2.0000 | Freq: Every day | NASAL | Status: AC

## 2015-12-05 MED ORDER — PROMETHAZINE-DM 6.25-15 MG/5ML PO SYRP: ORAL_SOLUTION | ORAL | Status: AC

## 2015-12-05 MED ORDER — PREDNISONE 20 MG PO TABS: ORAL_TABLET | ORAL | Status: AC

## 2015-12-05 NOTE — Progress Notes (Signed)
Patient ID: Tammy Gibbs, female   DOB: Feb 10, 1971, 45 y.o.   MRN: [id number]  HPI  Patient presents to the office for evaluation of cough.  It has been going on for 4 days.  Patient reports night > day, dry, worse with lying down.  They also endorse chills, postnasal drip and nasal congestion, sinus pressure, and left ear pain, sore throat, watering eyes, sneezing..  They have tried advil sinus congestion.  They report that nothing has worked.  They denies other sick contacts.  Review of Systems  Constitutional: Negative for fever, chills and malaise/fatigue.  HENT: Positive for congestion and ear pain. Negative for sore throat.   Respiratory: Negative for cough, shortness of breath and wheezing.   Cardiovascular: Negative for chest pain, palpitations and leg swelling.  Neurological: Negative for headaches.    PE:  Filed Vitals:   12/05/15 1414  BP: 132/80  Pulse: 100  Temp: 98.2 F (36.8 C)  Resp: 18    General:  Alert and non-toxic, WDWN, NAD HEENT: NCAT, PERLA, EOM normal, no occular discharge or erythema.  Nasal mucosal edema with sinus tenderness to palpation.  Oropharynx clear with minimal oropharyngeal edema and erythema.  Mucous membranes moist and pink. Neck:  Cervical adenopathy Chest:  RRR no MRGs.  Lungs clear to auscultation A&P with no wheezes rhonchi or rales.   Abdomen: +BS x 4 quadrants, soft, non-tender, no guarding, rigidity, or rebound. Skin: warm and dry no rash Neuro: A&Ox4, CN II-XII grossly intact  Assessment and Plan:   1. Acute URI -zyrtec -saline in nose - predniSONE (DELTASONE) 20 MG tablet; 1 tab 3 x day for 3 days, then 1 tab 2 x day for 3 days, then 1 tab 1 x day for 5 days  Dispense: 20 tablet; Refill: 0 - promethazine-dextromethorphan (PROMETHAZINE-DM) 6.25-15 MG/5ML syrup; Take 5-10 mL PO q8hrs prn for cough  Dispense: 360 mL; Refill: 1 - mometasone (NASONEX) 50 MCG/ACT nasal spray; Place 2 sprays into the nose daily.  Dispense: 17 g; Refill:  2

## 2016-08-27 ENCOUNTER — Ambulatory Visit: Payer: Self-pay | Admitting: Internal Medicine

## 2019-08-04 ENCOUNTER — Telehealth: Payer: Self-pay | Admitting: Internal Medicine

## 2019-08-04 NOTE — Telephone Encounter (Signed)
[facility] called to request : Please fax medical records on new patient to [facility], [phone]. 2017 records faxed- most recent.
